# Patient Record
Sex: Female | Born: 1975 | Race: Black or African American | Hispanic: No | Marital: Single | Smoking: Current every day smoker
Health system: Southern US, Community
[De-identification: ages and names within clinical notes are randomized; demographics above are authoritative.]

## PROBLEM LIST (undated history)

## (undated) ENCOUNTER — Inpatient Hospital Stay (HOSPITAL_COMMUNITY): Payer: Self-pay

## (undated) DIAGNOSIS — S86921A Laceration of unspecified muscle(s) and tendon(s) at lower leg level, right leg, initial encounter: Secondary | ICD-10-CM

## (undated) DIAGNOSIS — D219 Benign neoplasm of connective and other soft tissue, unspecified: Secondary | ICD-10-CM

## (undated) DIAGNOSIS — I82409 Acute embolism and thrombosis of unspecified deep veins of unspecified lower extremity: Secondary | ICD-10-CM

## (undated) DIAGNOSIS — D649 Anemia, unspecified: Secondary | ICD-10-CM

## (undated) DIAGNOSIS — E669 Obesity, unspecified: Secondary | ICD-10-CM

## (undated) DIAGNOSIS — S81011A Laceration without foreign body, right knee, initial encounter: Secondary | ICD-10-CM

## (undated) DIAGNOSIS — K449 Diaphragmatic hernia without obstruction or gangrene: Secondary | ICD-10-CM

## (undated) DIAGNOSIS — S83281A Other tear of lateral meniscus, current injury, right knee, initial encounter: Secondary | ICD-10-CM

## (undated) HISTORY — DX: Anemia, unspecified: D64.9

## (undated) HISTORY — PX: HIATAL HERNIA REPAIR: SHX195

## (undated) HISTORY — PX: DILATION AND CURETTAGE OF UTERUS: SHX78

## (undated) HISTORY — DX: Obesity, unspecified: E66.9

## (undated) HISTORY — PX: HERNIA REPAIR: SHX51

## (undated) HISTORY — DX: Acute embolism and thrombosis of unspecified deep veins of unspecified lower extremity: I82.409

## (undated) HISTORY — PX: NO PAST SURGERIES: SHX2092

---

## 1998-04-21 ENCOUNTER — Encounter: Admission: RE | Admit: 1998-04-21 | Discharge: 1998-04-21 | Payer: Self-pay | Admitting: Family Medicine

## 1998-04-22 ENCOUNTER — Encounter: Admission: RE | Admit: 1998-04-22 | Discharge: 1998-04-22 | Payer: Self-pay | Admitting: Family Medicine

## 1998-04-24 ENCOUNTER — Encounter: Admission: RE | Admit: 1998-04-24 | Discharge: 1998-04-24 | Payer: Self-pay | Admitting: Sports Medicine

## 1998-05-13 ENCOUNTER — Encounter: Admission: RE | Admit: 1998-05-13 | Discharge: 1998-05-13 | Payer: Self-pay | Admitting: Family Medicine

## 1998-05-28 ENCOUNTER — Encounter: Admission: RE | Admit: 1998-05-28 | Discharge: 1998-05-28 | Payer: Self-pay | Admitting: Family Medicine

## 1998-06-04 ENCOUNTER — Encounter: Admission: RE | Admit: 1998-06-04 | Discharge: 1998-06-04 | Payer: Self-pay | Admitting: Family Medicine

## 1998-06-11 ENCOUNTER — Encounter: Admission: RE | Admit: 1998-06-11 | Discharge: 1998-06-11 | Payer: Self-pay | Admitting: Sports Medicine

## 1998-07-21 ENCOUNTER — Encounter: Admission: RE | Admit: 1998-07-21 | Discharge: 1998-07-21 | Payer: Self-pay | Admitting: Sports Medicine

## 1998-09-04 ENCOUNTER — Encounter: Admission: RE | Admit: 1998-09-04 | Discharge: 1998-09-04 | Payer: Self-pay | Admitting: Family Medicine

## 1998-10-23 ENCOUNTER — Encounter: Admission: RE | Admit: 1998-10-23 | Discharge: 1998-10-23 | Payer: Self-pay | Admitting: Family Medicine

## 1998-10-30 ENCOUNTER — Encounter: Admission: RE | Admit: 1998-10-30 | Discharge: 1998-10-30 | Payer: Self-pay | Admitting: Family Medicine

## 1999-03-04 ENCOUNTER — Encounter: Admission: RE | Admit: 1999-03-04 | Discharge: 1999-03-04 | Payer: Self-pay | Admitting: Family Medicine

## 1999-06-09 ENCOUNTER — Encounter: Admission: RE | Admit: 1999-06-09 | Discharge: 1999-06-09 | Payer: Self-pay | Admitting: Family Medicine

## 1999-08-11 ENCOUNTER — Encounter: Admission: RE | Admit: 1999-08-11 | Discharge: 1999-08-11 | Payer: Self-pay | Admitting: Family Medicine

## 1999-08-31 ENCOUNTER — Encounter: Admission: RE | Admit: 1999-08-31 | Discharge: 1999-08-31 | Payer: Self-pay | Admitting: Family Medicine

## 2000-12-01 ENCOUNTER — Encounter: Admission: RE | Admit: 2000-12-01 | Discharge: 2000-12-01 | Payer: Self-pay | Admitting: Family Medicine

## 2000-12-01 ENCOUNTER — Other Ambulatory Visit: Admission: RE | Admit: 2000-12-01 | Discharge: 2000-12-01 | Payer: Self-pay | Admitting: Obstetrics & Gynecology

## 2001-03-04 ENCOUNTER — Emergency Department (HOSPITAL_COMMUNITY): Admission: EM | Admit: 2001-03-04 | Discharge: 2001-03-04 | Payer: Self-pay | Admitting: Emergency Medicine

## 2001-03-14 ENCOUNTER — Encounter: Admission: RE | Admit: 2001-03-14 | Discharge: 2001-03-14 | Payer: Self-pay | Admitting: Family Medicine

## 2001-04-11 ENCOUNTER — Encounter: Admission: RE | Admit: 2001-04-11 | Discharge: 2001-04-11 | Payer: Self-pay | Admitting: Family Medicine

## 2001-04-25 ENCOUNTER — Ambulatory Visit (HOSPITAL_COMMUNITY): Admission: RE | Admit: 2001-04-25 | Discharge: 2001-04-25 | Payer: Self-pay

## 2001-07-15 ENCOUNTER — Inpatient Hospital Stay (HOSPITAL_COMMUNITY): Admission: AD | Admit: 2001-07-15 | Discharge: 2001-07-17 | Payer: Self-pay | Admitting: Obstetrics & Gynecology

## 2001-10-12 ENCOUNTER — Encounter: Admission: RE | Admit: 2001-10-12 | Discharge: 2001-10-12 | Payer: Self-pay | Admitting: Family Medicine

## 2001-12-26 DIAGNOSIS — I82409 Acute embolism and thrombosis of unspecified deep veins of unspecified lower extremity: Secondary | ICD-10-CM

## 2001-12-26 HISTORY — DX: Acute embolism and thrombosis of unspecified deep veins of unspecified lower extremity: I82.409

## 2002-04-14 ENCOUNTER — Encounter: Payer: Self-pay | Admitting: Internal Medicine

## 2002-04-14 ENCOUNTER — Inpatient Hospital Stay (HOSPITAL_COMMUNITY): Admission: EM | Admit: 2002-04-14 | Discharge: 2002-04-26 | Payer: Self-pay | Admitting: Emergency Medicine

## 2002-04-15 ENCOUNTER — Encounter: Payer: Self-pay | Admitting: Internal Medicine

## 2002-04-21 ENCOUNTER — Encounter: Payer: Self-pay | Admitting: Internal Medicine

## 2002-04-22 ENCOUNTER — Encounter: Payer: Self-pay | Admitting: Internal Medicine

## 2002-06-13 ENCOUNTER — Encounter: Admission: RE | Admit: 2002-06-13 | Discharge: 2002-06-13 | Payer: Self-pay | Admitting: Family Medicine

## 2002-06-13 ENCOUNTER — Ambulatory Visit (HOSPITAL_COMMUNITY): Admission: RE | Admit: 2002-06-13 | Discharge: 2002-06-13 | Payer: Self-pay | Admitting: *Deleted

## 2003-05-27 ENCOUNTER — Encounter (INDEPENDENT_AMBULATORY_CARE_PROVIDER_SITE_OTHER): Payer: Self-pay | Admitting: *Deleted

## 2003-05-27 LAB — CONVERTED CEMR LAB

## 2003-06-09 ENCOUNTER — Encounter: Admission: RE | Admit: 2003-06-09 | Discharge: 2003-06-09 | Payer: Self-pay | Admitting: Family Medicine

## 2003-06-09 ENCOUNTER — Other Ambulatory Visit: Admission: RE | Admit: 2003-06-09 | Discharge: 2003-06-09 | Payer: Self-pay | Admitting: Family Medicine

## 2004-03-03 ENCOUNTER — Encounter: Admission: RE | Admit: 2004-03-03 | Discharge: 2004-03-03 | Payer: Self-pay | Admitting: Family Medicine

## 2005-08-25 ENCOUNTER — Ambulatory Visit: Payer: Self-pay | Admitting: Family Medicine

## 2006-05-18 ENCOUNTER — Emergency Department (HOSPITAL_COMMUNITY): Admission: EM | Admit: 2006-05-18 | Discharge: 2006-05-18 | Payer: Self-pay | Admitting: Emergency Medicine

## 2006-08-22 ENCOUNTER — Inpatient Hospital Stay (HOSPITAL_COMMUNITY): Admission: AD | Admit: 2006-08-22 | Discharge: 2006-08-23 | Payer: Self-pay | Admitting: Obstetrics and Gynecology

## 2006-08-24 ENCOUNTER — Inpatient Hospital Stay (HOSPITAL_COMMUNITY): Admission: RE | Admit: 2006-08-24 | Discharge: 2006-08-24 | Payer: Self-pay | Admitting: Obstetrics and Gynecology

## 2007-02-22 DIAGNOSIS — I80299 Phlebitis and thrombophlebitis of other deep vessels of unspecified lower extremity: Secondary | ICD-10-CM

## 2007-02-22 DIAGNOSIS — Z720 Tobacco use: Secondary | ICD-10-CM | POA: Insufficient documentation

## 2007-02-22 DIAGNOSIS — D509 Iron deficiency anemia, unspecified: Secondary | ICD-10-CM

## 2007-02-22 DIAGNOSIS — E669 Obesity, unspecified: Secondary | ICD-10-CM

## 2007-02-22 HISTORY — DX: Phlebitis and thrombophlebitis of other deep vessels of unspecified lower extremity: I80.299

## 2007-02-23 ENCOUNTER — Encounter (INDEPENDENT_AMBULATORY_CARE_PROVIDER_SITE_OTHER): Payer: Self-pay | Admitting: *Deleted

## 2007-10-13 ENCOUNTER — Emergency Department (HOSPITAL_COMMUNITY): Admission: EM | Admit: 2007-10-13 | Discharge: 2007-10-13 | Payer: Self-pay | Admitting: Emergency Medicine

## 2007-10-15 ENCOUNTER — Inpatient Hospital Stay (HOSPITAL_COMMUNITY): Admission: AD | Admit: 2007-10-15 | Discharge: 2007-10-15 | Payer: Self-pay | Admitting: Family Medicine

## 2007-11-01 ENCOUNTER — Encounter (INDEPENDENT_AMBULATORY_CARE_PROVIDER_SITE_OTHER): Payer: Self-pay | Admitting: *Deleted

## 2007-11-06 ENCOUNTER — Telehealth: Payer: Self-pay | Admitting: *Deleted

## 2007-11-06 ENCOUNTER — Inpatient Hospital Stay (HOSPITAL_COMMUNITY): Admission: AD | Admit: 2007-11-06 | Discharge: 2007-11-06 | Payer: Self-pay | Admitting: Obstetrics & Gynecology

## 2007-12-21 ENCOUNTER — Telehealth (INDEPENDENT_AMBULATORY_CARE_PROVIDER_SITE_OTHER): Payer: Self-pay | Admitting: Family Medicine

## 2007-12-24 ENCOUNTER — Ambulatory Visit: Payer: Self-pay | Admitting: Sports Medicine

## 2007-12-24 ENCOUNTER — Encounter: Payer: Self-pay | Admitting: Family Medicine

## 2007-12-24 LAB — CONVERTED CEMR LAB
Antibody Screen: NEGATIVE
Basophils Relative: 0 % (ref 0–1)
Eosinophils Absolute: 0.1 10*3/uL (ref 0.0–0.7)
Eosinophils Relative: 1 % (ref 0–5)
Hemoglobin: 9.7 g/dL — ABNORMAL LOW (ref 12.0–15.0)
Lymphs Abs: 1.8 10*3/uL (ref 0.7–4.0)
MCHC: 31.2 g/dL (ref 30.0–36.0)
Monocytes Relative: 8 % (ref 3–12)
Neutrophils Relative %: 67 % (ref 43–77)
Platelets: 292 10*3/uL (ref 150–400)
Rh Type: POSITIVE

## 2008-01-01 ENCOUNTER — Other Ambulatory Visit: Admission: RE | Admit: 2008-01-01 | Discharge: 2008-01-01 | Payer: Self-pay | Admitting: Family Medicine

## 2008-01-01 ENCOUNTER — Ambulatory Visit: Payer: Self-pay | Admitting: Family Medicine

## 2008-01-01 DIAGNOSIS — R12 Heartburn: Secondary | ICD-10-CM

## 2008-01-01 DIAGNOSIS — F411 Generalized anxiety disorder: Secondary | ICD-10-CM

## 2008-01-01 DIAGNOSIS — D649 Anemia, unspecified: Secondary | ICD-10-CM

## 2008-01-01 HISTORY — DX: Generalized anxiety disorder: F41.1

## 2008-01-01 LAB — CONVERTED CEMR LAB: Glucose, Urine, Semiquant: NEGATIVE

## 2008-01-02 ENCOUNTER — Encounter: Payer: Self-pay | Admitting: Family Medicine

## 2008-01-02 LAB — CONVERTED CEMR LAB: GC Probe Amp, Genital: NEGATIVE

## 2008-01-03 LAB — CONVERTED CEMR LAB: GC Culture Only: NEGATIVE

## 2008-01-07 ENCOUNTER — Encounter: Payer: Self-pay | Admitting: Family Medicine

## 2008-01-09 ENCOUNTER — Encounter: Payer: Self-pay | Admitting: Family Medicine

## 2008-01-09 ENCOUNTER — Telehealth: Payer: Self-pay | Admitting: *Deleted

## 2008-01-10 ENCOUNTER — Encounter: Payer: Self-pay | Admitting: Family Medicine

## 2008-01-10 ENCOUNTER — Ambulatory Visit: Payer: Self-pay | Admitting: Family Medicine

## 2008-01-11 DIAGNOSIS — T7491XA Unspecified adult maltreatment, confirmed, initial encounter: Secondary | ICD-10-CM | POA: Insufficient documentation

## 2008-01-11 HISTORY — DX: Unspecified adult maltreatment, confirmed, initial encounter: T74.91XA

## 2008-01-14 ENCOUNTER — Ambulatory Visit (HOSPITAL_COMMUNITY): Admission: RE | Admit: 2008-01-14 | Discharge: 2008-01-14 | Payer: Self-pay | Admitting: Family Medicine

## 2008-01-14 ENCOUNTER — Encounter: Payer: Self-pay | Admitting: Family Medicine

## 2008-01-16 ENCOUNTER — Telehealth: Payer: Self-pay | Admitting: *Deleted

## 2008-01-23 ENCOUNTER — Encounter: Payer: Self-pay | Admitting: Family Medicine

## 2008-01-24 ENCOUNTER — Telehealth: Payer: Self-pay | Admitting: Family Medicine

## 2008-01-29 ENCOUNTER — Telehealth: Payer: Self-pay | Admitting: Family Medicine

## 2008-02-06 ENCOUNTER — Encounter: Payer: Self-pay | Admitting: Family Medicine

## 2008-02-06 ENCOUNTER — Ambulatory Visit: Payer: Self-pay | Admitting: Family Medicine

## 2008-02-07 LAB — CONVERTED CEMR LAB: Glucose, 2 hour: 99 mg/dL (ref 70–139)

## 2008-03-03 ENCOUNTER — Telehealth: Payer: Self-pay | Admitting: *Deleted

## 2008-03-05 ENCOUNTER — Ambulatory Visit: Payer: Self-pay | Admitting: Family Medicine

## 2008-03-05 ENCOUNTER — Encounter: Payer: Self-pay | Admitting: Family Medicine

## 2008-03-06 ENCOUNTER — Encounter: Payer: Self-pay | Admitting: Family Medicine

## 2008-03-20 ENCOUNTER — Encounter: Payer: Self-pay | Admitting: Family Medicine

## 2008-03-20 ENCOUNTER — Ambulatory Visit: Payer: Self-pay | Admitting: Family Medicine

## 2008-03-20 LAB — CONVERTED CEMR LAB

## 2008-03-25 LAB — CONVERTED CEMR LAB
Hemoglobin: 7.9 g/dL — CL (ref 12.0–15.0)
MCHC: 29 g/dL — ABNORMAL LOW (ref 30.0–36.0)
Platelets: 232 10*3/uL (ref 150–400)
RBC: 3.61 M/uL — ABNORMAL LOW (ref 3.87–5.11)
RDW: 17.6 % — ABNORMAL HIGH (ref 11.5–15.5)

## 2008-03-27 ENCOUNTER — Inpatient Hospital Stay (HOSPITAL_COMMUNITY): Admission: AD | Admit: 2008-03-27 | Discharge: 2008-03-27 | Payer: Self-pay | Admitting: Obstetrics & Gynecology

## 2008-04-07 ENCOUNTER — Encounter: Payer: Self-pay | Admitting: Family Medicine

## 2008-04-07 ENCOUNTER — Ambulatory Visit: Payer: Self-pay | Admitting: Sports Medicine

## 2008-04-07 LAB — CONVERTED CEMR LAB: Glucose, Urine, Semiquant: NEGATIVE

## 2008-04-24 ENCOUNTER — Telehealth: Payer: Self-pay | Admitting: Family Medicine

## 2008-04-30 ENCOUNTER — Ambulatory Visit: Payer: Self-pay | Admitting: Family Medicine

## 2008-04-30 LAB — CONVERTED CEMR LAB

## 2008-05-01 ENCOUNTER — Ambulatory Visit (HOSPITAL_COMMUNITY): Admission: RE | Admit: 2008-05-01 | Discharge: 2008-05-01 | Payer: Self-pay | Admitting: Family Medicine

## 2008-05-01 ENCOUNTER — Encounter: Payer: Self-pay | Admitting: Family Medicine

## 2008-05-15 ENCOUNTER — Encounter: Payer: Self-pay | Admitting: Family Medicine

## 2008-05-15 ENCOUNTER — Ambulatory Visit: Payer: Self-pay | Admitting: Family Medicine

## 2008-05-15 LAB — CONVERTED CEMR LAB
GC Probe Amp, Genital: NEGATIVE
Protein, U semiquant: NEGATIVE
Whiff Test: NEGATIVE

## 2008-05-20 ENCOUNTER — Inpatient Hospital Stay (HOSPITAL_COMMUNITY): Admission: AD | Admit: 2008-05-20 | Discharge: 2008-05-20 | Payer: Self-pay | Admitting: Obstetrics & Gynecology

## 2008-05-21 ENCOUNTER — Ambulatory Visit: Payer: Self-pay | Admitting: Family Medicine

## 2008-05-21 LAB — CONVERTED CEMR LAB

## 2008-05-27 ENCOUNTER — Inpatient Hospital Stay (HOSPITAL_COMMUNITY): Admission: AD | Admit: 2008-05-27 | Discharge: 2008-05-29 | Payer: Self-pay | Admitting: Gynecology

## 2008-05-27 ENCOUNTER — Ambulatory Visit: Payer: Self-pay | Admitting: *Deleted

## 2008-06-02 ENCOUNTER — Telehealth: Payer: Self-pay | Admitting: *Deleted

## 2009-06-19 ENCOUNTER — Ambulatory Visit: Payer: Self-pay | Admitting: Family Medicine

## 2009-06-19 ENCOUNTER — Encounter: Payer: Self-pay | Admitting: Family Medicine

## 2009-06-22 ENCOUNTER — Encounter: Payer: Self-pay | Admitting: Family Medicine

## 2009-06-22 LAB — CONVERTED CEMR LAB
ALT: <8 units/L (ref 0–35)
Albumin: 3.9 g/dL (ref 3.5–5.2)
BUN: 8 mg/dL (ref 6–23)
Calcium: 8.5 mg/dL (ref 8.4–10.5)
Chloride: 111 meq/L (ref 96–112)
Cholesterol: 214 mg/dL — ABNORMAL HIGH (ref 0–200)
Creatinine, Ser: 0.88 mg/dL (ref 0.40–1.20)
HCT: 32.5 % — ABNORMAL LOW (ref 36.0–46.0)
HDL: 39 mg/dL — ABNORMAL LOW (ref >39)
Hemoglobin: 9.5 g/dL — ABNORMAL LOW (ref 12.0–15.0)
LDL Cholesterol: 154 mg/dL — ABNORMAL HIGH (ref 0–99)
MCV: 76.5 fL — ABNORMAL LOW (ref 78.0–100.0)
Potassium: 4.1 meq/L (ref 3.5–5.3)
RBC: 4.25 M/uL (ref 3.87–5.11)
Sodium: 141 meq/L (ref 135–145)
Total Bilirubin: 0.3 mg/dL (ref 0.3–1.2)
Total Protein: 6.6 g/dL (ref 6.0–8.3)
VLDL: 21 mg/dL (ref 0–40)

## 2009-06-23 ENCOUNTER — Encounter: Payer: Self-pay | Admitting: *Deleted

## 2009-09-08 ENCOUNTER — Inpatient Hospital Stay (HOSPITAL_COMMUNITY): Admission: AD | Admit: 2009-09-08 | Discharge: 2009-09-08 | Payer: Self-pay | Admitting: Family Medicine

## 2009-09-10 ENCOUNTER — Emergency Department (HOSPITAL_COMMUNITY): Admission: EM | Admit: 2009-09-10 | Discharge: 2009-09-10 | Payer: Self-pay | Admitting: Family Medicine

## 2010-07-12 ENCOUNTER — Encounter: Payer: Self-pay | Admitting: Family Medicine

## 2010-12-16 ENCOUNTER — Encounter: Payer: Self-pay | Admitting: Family Medicine

## 2011-01-16 ENCOUNTER — Encounter: Payer: Self-pay | Admitting: Obstetrics and Gynecology

## 2011-01-25 NOTE — Consult Note (Signed)
Summary: Francene Boyers, O.D., P.A.  Francene Boyers, O.D., P.A.   Imported By: Knox Royalty 07/17/2010 12:43:35  _____________________________________________________________________  External Attachment:    Type:   Image     Comment:   External Document

## 2011-01-27 NOTE — Miscellaneous (Signed)
  Clinical Lists Changes  Problems: Removed problem of CONTRACEPTIVE MANAGEMENT (ICD-V25.09) Removed problem of SCREENING FOR ISCHEMIC HEART DISEASE (ICD-V81.0) Removed problem of GROUP B STREPTOCOCCUS CARRIER (ICD-V02.51)

## 2011-02-09 ENCOUNTER — Ambulatory Visit (HOSPITAL_COMMUNITY): Payer: Self-pay

## 2011-02-16 ENCOUNTER — Encounter: Payer: Self-pay | Admitting: *Deleted

## 2011-03-07 ENCOUNTER — Encounter: Payer: Self-pay | Admitting: *Deleted

## 2011-04-01 LAB — GC/CHLAMYDIA PROBE AMP, GENITAL
Chlamydia, DNA Probe: NEGATIVE
GC Probe Amp, Genital: NEGATIVE

## 2011-04-01 LAB — POCT I-STAT, CHEM 8: HCT: 40 % (ref 36.0–46.0)

## 2011-04-01 LAB — WET PREP, GENITAL: Yeast Wet Prep HPF POC: NONE SEEN

## 2011-05-13 NOTE — Discharge Summary (Signed)
[facility]  Patient:    Candace Ramirez, Candace Ramirez Visit Number: [id number] MRN: [id number]          Service Type: MED Location: [phone] Attending Physician:  Miguel Aschoff Dictated by:   [name], M.D. Admit Date:  04/14/2002 Discharge Date: 04/26/2002   CC:         Dr. Allyson Sabal, Emmaline Kluver  Adolph Pollack, M.D.   Discharge Summary  DISCHARGE DIAGNOSES: 1. Left lower extremity deep venous thrombosis. 2. Bullous cellulitis. 3. Cigarette smoking. 4. Obesity. 5. Microcytic anemia consistent with iron deficiency.    a. Microcytic anemia, discharge hemoglobin 8.5, hematocrit 26.9, MCV 73.5.    b. Total iron 46, total iron binding capacity 274, percent saturation 17,       ferritin 18.  DISCHARGE MEDICATIONS: 1. Coumadin 5 mg p.o. q.d. with 7-day prescription.  (The patient will be    seen by Dr. Allyson Sabal on Monday, Apr 29, 2002, whereupon the Coumadin dose    will be adjusted based on INR.) 2. Ferrous sulfate 325 mg p.o. t.i.d. 3. Keflex 500 mg p.o. q.i.d. 4. Motrin 800 mg p.o. t.i.d. p.r.n. 5. Nicotine patch 40 mg, 24-hour patch, 1 patch to apply q.d. (Remove old    patch before applying new patch.)  DIAGNOSTIC DATA:  WBC 4.6, hemoglobin 8.5, hematocrit 36.8, MCV 73.5, platelet count 424.  Pro time 22.7, INR 2.3.  Sodium 140, potassium 3.7, chloride 108, CO2 27, glucose 98, BUN 2, creatinine 0.8, calcium 8.8.  HIV studies nonreactive.  CONSULTATIONS:  Dr. Orvan Falconer of infectious disease and Adolph Pollack, M.D. of general surgery.  PROCEDURES:  Incision and drainage of abscess of the left lower extremity by Dr. Derryl Harbor.  CONDITION UPON DISCHARGE:  Improved and stable.  ACTIVITY:  As tolerated.  DIET:  Regular.  WOUND CARE:  The patient was asked to do soaking of her affected foot daily as she has been doing in house.  The patient will be followed by [facility] to provide assistance with dressing changes at  home.  SPECIAL INSTRUCTIONS:  The patient has been instructed to stop smoking, keep left foot elevated when she is at rest, and not walk.  She will read book on Warfarin.  She has been asked to check her stools and tell MD if there is any black color of her stools.  She is to avoid activities which may cause traumatic injury.  She was to go to MD if she experiences any dizziness, fever, chills, or worsening swelling or redness of left lower extremity.  FOLLOWUP:  Appointment will be with Dr. Allyson Sabal of [facility], [phone], at 11 a.m. on Monday, Apr 29, 2002.  She is also to follow up with Dr. Abbey Chatters in two weeks.  She is to call for appointment. Dr. Arne Cleveland telephone number has been given to the patient and is [phone].  HISTORY OF PRESENT ILLNESS:  [name] was admitted on April 14, 2002, by Dr. Nehemiah Settle because of leg pain and swelling.  There was no history of chest pain, shortness of breath, PND, dyspnea on exertion, or palpitations.  There was no history of any leg trauma.  She had not had any long trips and has not been on any long plane rides.  She has not been on any birth control pills for more than three years.  She does not have any history of DVT or PE in her family.  Denies family history of miscarriages.  The patient was admitted  to the medical service on account of sonography which revealed DVT in her left calf.  For complete admitting information, please see H&P dictated by Dr. Nehemiah Settle on April 14, 2002.  HOSPITAL COURSE:  #1 - LEFT CALF DEEP VENOUS THROMBOSIS:  The patient was admitted to the medical service.  She was placed on anticoagulation treatment.  Workup of protein, prothrombin gene mutation, factor V Leiden, and ANA and anticardiolipin as well as total protein C and protein S within normal limits. Antithrombin III was also normal.  She had positive glucose anticoagulant of uncertain significance.  Her INR for the last three days have been  therapeutic (within 2.0 to 3.0).  She will need about six months of anticoagulation with Coumadin.  The patient has been started on the use of Coumadin and the need to avoid any traumatic activities as indicated above.  I discussed the side effects of Coumadin with respect to thinning of blood.  She will be checking her teeth for any evidence of bleed or any evidence of blood per teeth.  She has been instructed that she should report any dizziness.  In our opinion, believe cigarette smoking may be etiologic in this patient.  She is instructed to stop smoking and lose weight.  Since in this patient has been recurrent, believe that she may need lifelong Coumadin therapy.  #2 - SEVERE CELLULITIS IN LEFT LOWER EXTREMITY:  The patient developed redness and hematoma of left lower extremity, and she was initiated on IV antibiotics and vancomycin and gentamicin initially on account of cellulitis.  She has some bullous lesions around her feet as well.  We have consulted Dr. Orvan Falconer of infectious disease who ______.  The patient remained afebrile, and her leukocyte counts were within normal limits.  She had some improvement in erythema and swelling, and she was subsequently switched to p.o. Keflex on the day before discharge.  She remained afebrile throughout the last 24 hours of her hospital stay.  The bullous lesions were initially seen on MRI which indicated edema with some abscess.  The abscess were drained by Dr. Abbey Chatters of general surgery with initiation of daily dressing changes with soaking of left lower extremity.  Dr. Abbey Chatters agrees with our discharge plan, and the patient will be following up with him as indicated above.  We also initiated followup care by [facility] at home.  The patient will continue antibiotics Keflex for a total of 7 more days.  HIV testing was advised by infectious disease on account of spontaneous DVT in  otherwise healthy lady, and the testing was  negative.  #3 - MICROCYTIC ANEMIA SECONDARY TO IRON DEFICIENCY:  The patient has a history of menorrhagia, and this is most likely etiologic cause of anemia. She is currently asymptomatic.  She is on iron tablets, and she will need outpatient follow up of hemoglobin as well as workup of menorrhagia by a gynecologist.  #4 - CIGARETTE SMOKING:  The patient has been counselled with respect the affect of smoking on her general health as well as DVT as indicated above. She has agreed to smoking cessation program which was initiated in house with nicotine patch as indicated above, and she will need outpatient reinforcement of cigarette smoking cessation.  I spent more than 45 minutes a day in coordinating discharge planning and discharge followup of this patient. Dictated by:   [name], M.D. Attending Physician:  Miguel Aschoff DD:  04/26/02 TD:  04/29/02 Job: [id number]

## 2011-05-13 NOTE — Consult Note (Signed)
[facility]  Patient:    Candace Ramirez, Candace Ramirez Visit Number: [id number] MRN: [id number]          Service Type: MED Location: [phone] Attending Physician:  Gracelyn Nurse Dictated by:   Adolph Pollack, M.D. Admit Date:  04/14/2002   CC:         [name], M.D.   Consultation Report  REFERRING PHYSICIAN:  [name], M.D.  REASON FOR CONSULTATION:  "Bullous left lower extremity cellulitis."  HISTORY OF PRESENT ILLNESS:  The patient is a 35 year old female who awoke with left leg pain that continued through the day and was associated with swelling.  She presented and was evaluated and was noted to have a deep vein thrombosis.  She subsequently was started on anticoagulation with heparin and Coumadin.  She was febrile at the time, with a temperature up to 103.1 degrees.  It was noted that she was having some left lower extremity cellulitis and had been started on Ancef.  Bullous lesions were then noted to be forming.  An infectious disease consult was obtained.  There was some question of Warfarin-type necrosis.  This was felt to be just a severe cellulitis with staph or strep compounded by the fact that she had a deep venous thrombosis.  Despite antibiotics, she continued to have no significant improvement, and I was asked to see her for that reason.  PAST MEDICAL HISTORY: 1. Anemia. 2. Morbid obesity.  PAST SURGICAL HISTORY:  None.  ALLERGIES:  None reported.  SOCIAL HISTORY:  She smokes 1/2 pack of cigarettes per day.  She denies alcohol use.  MEDICATIONS:  Remarkable for the fact that she is on Unasyn and vancomycin by way of a PICC line.  This was discontinued today and she started back on Ancef 2 g IV q.8h.  PHYSICAL EXAMINATION:  GENERAL:  Obese female in no acute distress.  Very pleasant and cooperative. She is afebrile.  EXTREMITIES:  The right lower extremity is soft.  Multiple healed ulcerated-type wounds.   The left lower extremity has swelling below the knee. Erythema from the distal third of the lower extremity down through the foot. There is a fluctuant lesion on the lateral aspect of the foot that is tender to palpation.  PROCEDURE:  After sterilely preparing this area, I used a 21-gauge needle and aspirated purulent fluid.  I then made a cruciate incision in the area sharply and drained pus.  Some of this was sent for culture.  IMPRESSION: 1. Left lower extremity deep venous thrombosis - etiology unknown. 2. Left lower extremity cellulitis and abscess status post simple    incision and drainage.  PLAN:  Warm water soaks and packing of open wound twice a day.  Would continue IV antibiotics.  Careful wound observation. Dictated by:   Adolph Pollack, M.D. Attending Physician:  Marcelino Duster D DD:  04/23/02 TD:  04/23/02 Job: [id number]

## 2011-05-13 NOTE — H&P (Signed)
[facility]  Patient:    Candace Ramirez, Candace Ramirez Visit Number: [id number] MRN: [id number]          Service Type: MED Location: [phone] Attending Physician:  Gracelyn Nurse Dictated by:   Renford Dills, M.D. Admit Date:  04/14/2002                           History and Physical  DATE OF BIRTH:  [birth year].  PROBLEM LIST: 1. Left lower extremity deep vein thrombosis in the calf veins. 2. Obesity. 3. Tobacco abuse.  CHIEF COMPLAINT:  Left leg pain.  HISTORY OF PRESENT ILLNESS:  A 35 year old black female with above medical problems, who presented to the ED complaining of left leg pain, swelling, and redness.  The patient stated that she just was in her usual state of health and just awoke with leg pain and swelling, which was aggravated by walking and moving around and stated that the symptoms progressed; therefore, she presented to the ED.  She denied any chest pain, shortness of breath, PND, no dyspnea on exertion, no palpitations.  Denies any leg trauma.  No long trips, no long plane rides, no birth control pills x3 years.  No history of DVT or PE in herself or her family.  Denies any miscarriages as well.  Ultrasound in the ED revealed clot in the left calf veins.  Admission was deemed necessary for further evaluation and treatment.  PAST MEDICAL HISTORY:  See problem list.  MEDICATIONS:  None.  The patient denies birth control pills, no aspirin, no multivitamins, no herbal medicines.  SOCIAL HISTORY:  Positive for tobacco, half pack a day x5 years.  No alcohol and no drugs.  PAST SURGICAL HISTORY:  Negative.  ALLERGIES:  No known drug allergies.  REVIEW OF SYSTEMS:  Per HPI.  FAMILY HISTORY:  Mother healthy, no problems.  Father deceased.  Patient does not know why.  Patient does state that her paternal and maternal grandmothers both had cancer; however, she is unaware of the type.  PHYSICAL EXAMINATION:  VITAL SIGNS:  BP  119/53, pulse 104, respiratory rate of 18, saturating 95% on room air.  Temperature 101.9, repeat 97.9.  GENERAL:  The patient is in moderate distress secondary to leg pain.  HEENT:  Pupils are equal, round and reactive to light.  NECK:  Supple.  No adenopathy appreciated.  CHEST:  Clear to auscultation.  CARDIAC:  Regular, no S3.  ABDOMEN:  Morbid obesity.  EXTREMITIES:  2+ pulse to the left lower extremity.  Significant for erythema over the medial malleolus.  There is positive calf pain and a positive Homans sign.  NEUROLOGIC:  Otherwise nonfocal.  RECTAL:  No mass, heme-negative.  SKIN:  Multiple macular scars on the legs and face from repeated excoriation.  LABORATORY DATA:  BMET essentially normal.  CBC:  Hemoglobin was 10.2.  PT 15.3, INR 1.3, PTT of 44.  ASSESSMENT AND PLAN: 1. Deep vein thrombosis in the calf vein in an obese smoker.  Patient with an    extremely symptomatic leg.  Will treat with Lovenox with conversion to    Coumadin; however, an option may be to do serial ultrasounds and to treat    only if propagation of clot.  Will defer this treatment if deemed    appropriate by the following of _____ and probably will recommend a    hematology consult if patient is to be followed up with  serial ultrasounds.    However, in light of the patients severe symptoms from her deep vein    thrombosis, recommend treatment at this time.  Patients risk factors for    clotting include smoking and obesity.  At this time because of the mildly    abnormal coagulation studies, will also obtain a hypocoagulable evaluation.    It is unknown if the above coagulation studies represented error or not. 2. Anemia with low MCV.  The patient is fairly young with a history of    menorrhagia and noncompliance with iron replacement.  Will continue iron    and follow up at the appropriate treatment. 3. Obesity.  Needs lifestyle modification.  Probably should check lipids and    fasting  CBG. 4. Elevated temperature, probably secondary to deep vein thrombosis.  The    patients left lower extremity has warmth and erythema.  Recommend    follow-up for signs of cellulitis/phlebitis.  Will make further recommendations after review of the above studies. Dictated by:   Renford Dills, M.D. Attending Physician:  Marcelino Duster D DD:  04/15/02 TD:  04/15/02 Job: [id number]

## 2011-07-21 ENCOUNTER — Encounter: Payer: Self-pay | Admitting: Family Medicine

## 2011-07-21 ENCOUNTER — Ambulatory Visit (INDEPENDENT_AMBULATORY_CARE_PROVIDER_SITE_OTHER): Payer: Self-pay | Admitting: Family Medicine

## 2011-07-21 ENCOUNTER — Other Ambulatory Visit (HOSPITAL_COMMUNITY)
Admission: RE | Admit: 2011-07-21 | Discharge: 2011-07-21 | Disposition: A | Discharge status: Home / Self Care | Payer: Self-pay | Source: Ambulatory Visit | Attending: Family Medicine | Admitting: Family Medicine

## 2011-07-21 DIAGNOSIS — Z7189 Other specified counseling: Secondary | ICD-10-CM

## 2011-07-21 DIAGNOSIS — Z309 Encounter for contraceptive management, unspecified: Secondary | ICD-10-CM

## 2011-07-21 DIAGNOSIS — Z01419 Encounter for gynecological examination (general) (routine) without abnormal findings: Secondary | ICD-10-CM | POA: Insufficient documentation

## 2011-07-21 DIAGNOSIS — M25472 Effusion, left ankle: Secondary | ICD-10-CM

## 2011-07-21 DIAGNOSIS — Z202 Contact with and (suspected) exposure to infections with a predominantly sexual mode of transmission: Secondary | ICD-10-CM

## 2011-07-21 DIAGNOSIS — F411 Generalized anxiety disorder: Secondary | ICD-10-CM

## 2011-07-21 DIAGNOSIS — R631 Polydipsia: Secondary | ICD-10-CM

## 2011-07-21 DIAGNOSIS — Z124 Encounter for screening for malignant neoplasm of cervix: Secondary | ICD-10-CM

## 2011-07-21 DIAGNOSIS — F17200 Nicotine dependence, unspecified, uncomplicated: Secondary | ICD-10-CM

## 2011-07-21 DIAGNOSIS — M25473 Effusion, unspecified ankle: Secondary | ICD-10-CM

## 2011-07-21 LAB — BASIC METABOLIC PANEL
BUN: 11 mg/dL (ref 6–23)
CO2: 23 mEq/L (ref 19–32)
Calcium: 9.1 mg/dL (ref 8.4–10.5)
Creat: 1.01 mg/dL (ref 0.50–1.10)
Glucose, Bld: 92 mg/dL (ref 70–99)

## 2011-07-21 MED ORDER — MEDROXYPROGESTERONE ACETATE 150 MG/ML IM SUSP
150.0000 mg | Freq: Once | INTRAMUSCULAR | Status: AC
  Administered 2011-07-21: 150 mg via INTRAMUSCULAR

## 2011-07-21 NOTE — Progress Notes (Signed)
  Subjective:    Patient ID: Candace Ramirez, female    DOB: 09/12/1976, 35 y.o.   MRN: [id number]  HPI Concern for Diabetes- No family history.  Concerned may have this b/c overweight.  Some increased thirst.  Increased hunger x 1 month.  No increased urinary frequency.  Concern for STD's- 1 partner, unsure if exposed,  Partner may have other partner.  No vaginal discharge.  Last menstrual period 07/06/11.  Not using condoms with partner  Birth control- Would like to be on birth control.  Would like depo injection.  Left ankle swelling- Had blood clot and abscess in left ankle in 2003. Since then has had swelling in left foot when up on feet for a long peroid of time.  No redness in joint.  No swelling in right ankle.    Health promotion- Last pap smear about 3 years ago.  Unsure if normal in past.  States she has never had a colposcopy. No nipple discharge.  No new lumps or bumps in breast.       Review of Systems     Objective:   Physical Exam  Constitutional: She is oriented to person, place, and time. She appears well-developed.       obese  HENT:  Head: Normocephalic and atraumatic.  Cardiovascular: Normal rate, regular rhythm and normal heart sounds.  Exam reveals no gallop and no friction rub.   No murmur heard. Pulmonary/Chest: Effort normal and breath sounds normal. No respiratory distress.  Abdominal: Soft. Bowel sounds are normal. She exhibits no distension. There is no tenderness.  Genitourinary: Vagina normal and uterus normal. No breast swelling or discharge. There is no rash or tenderness on the right labia. There is no rash or tenderness on the left labia. Cervix exhibits no motion tenderness and no friability. Right adnexum displays no mass and no tenderness. Left adnexum displays no mass and no tenderness.       Scant vaginal discharge  Musculoskeletal: Normal range of motion. She exhibits no edema.       Ankle exam: Full rom, no edema, no joint redness, no  pain with palpation bilateral.   Neurological: She is alert and oriented to person, place, and time.  Skin: No rash noted.  Psychiatric: She has a normal mood and affect. Her behavior is normal.          Assessment & Plan:

## 2011-07-21 NOTE — Patient Instructions (Signed)
Labs and pap smear: I will mail you the results or call if any medication needed. Return in 2-4 weeks for appointment to discuss your OCD and other symptoms you are having and possible treatment plans.

## 2011-07-22 ENCOUNTER — Encounter: Payer: Self-pay | Admitting: Family Medicine

## 2011-07-22 LAB — GC/CHLAMYDIA PROBE AMP, GENITAL: Chlamydia, DNA Probe: NEGATIVE

## 2011-08-08 ENCOUNTER — Encounter: Payer: Self-pay | Admitting: Family Medicine

## 2011-08-09 DIAGNOSIS — Z309 Encounter for contraceptive management, unspecified: Secondary | ICD-10-CM | POA: Insufficient documentation

## 2011-08-09 DIAGNOSIS — Z7189 Other specified counseling: Secondary | ICD-10-CM | POA: Insufficient documentation

## 2011-08-09 DIAGNOSIS — M25472 Effusion, left ankle: Secondary | ICD-10-CM | POA: Insufficient documentation

## 2011-08-09 DIAGNOSIS — Z202 Contact with and (suspected) exposure to infections with a predominantly sexual mode of transmission: Secondary | ICD-10-CM | POA: Insufficient documentation

## 2011-08-09 MED ORDER — MEDROXYPROGESTERONE ACETATE 150 MG/ML IM SUSP: 150.0000 mg | INTRAMUSCULAR | Status: DC

## 2011-08-09 NOTE — Assessment & Plan Note (Signed)
Pt given depo injection.  To return in 3 months.  Pt also considering placement of iud.

## 2011-08-09 NOTE — Assessment & Plan Note (Signed)
Off and on, exam wnl at office visit, most likely due to change in vasculature s/p surgery on this ankle, since this has been present since ankle sugery years ago.  Pt to keep elevated when at rest to decrease swelling.  Encouraged activity.  Pt to return if new or worsening of symptoms.

## 2011-08-09 NOTE — Assessment & Plan Note (Signed)
Encouraged smoking cessation 

## 2011-08-09 NOTE — Assessment & Plan Note (Signed)
GC/Chlam and wet prep performed.  Also HIV and RPR drawn.  Reviewed importance of safe sex.

## 2011-08-09 NOTE — Assessment & Plan Note (Signed)
Did not have time to discuss at today's appoint.  Pt to make an appointment in 2-4 weeks to discuss in more detail.

## 2011-08-09 NOTE — Assessment & Plan Note (Signed)
Pap smear completed 07/21/11- WNL Encouraged weight loss. CGB performed to screen for diabetes since at increased risk due to obesity and family history- was wnl.

## 2011-09-20 LAB — URINALYSIS, ROUTINE W REFLEX MICROSCOPIC
Glucose, UA: NEGATIVE
Hgb urine dipstick: NEGATIVE
Specific Gravity, Urine: 1.02
pH: 7

## 2011-09-20 LAB — WET PREP, GENITAL
Clue Cells Wet Prep HPF POC: NONE SEEN
Trich, Wet Prep: NONE SEEN
Yeast Wet Prep HPF POC: NONE SEEN

## 2011-09-20 LAB — GC/CHLAMYDIA PROBE AMP, GENITAL: GC Probe Amp, Genital: NEGATIVE

## 2011-09-22 LAB — CBC
HCT: 25.1 — ABNORMAL LOW
Hemoglobin: 8 — ABNORMAL LOW
MCHC: 31.9
MCV: 71.8 — ABNORMAL LOW
Platelets: 243
RDW: 21.1 — ABNORMAL HIGH
RDW: 21.2 — ABNORMAL HIGH
WBC: 9
WBC: 9.6

## 2011-09-22 LAB — RPR: RPR Ser Ql: NONREACTIVE

## 2011-10-05 LAB — URINALYSIS, ROUTINE W REFLEX MICROSCOPIC
Glucose, UA: NEGATIVE
Hgb urine dipstick: NEGATIVE
Ketones, ur: NEGATIVE
Protein, ur: NEGATIVE

## 2011-10-05 LAB — CBC
HCT: 33.7 — ABNORMAL LOW
MCV: 78.3
Platelets: 354
RDW: 17.8 — ABNORMAL HIGH

## 2011-10-05 LAB — WET PREP, GENITAL: Trich, Wet Prep: NONE SEEN

## 2011-10-05 LAB — POCT PREGNANCY, URINE
Operator id: 192351
Preg Test, Ur: POSITIVE

## 2012-02-07 ENCOUNTER — Encounter (HOSPITAL_COMMUNITY): Payer: Self-pay | Admitting: *Deleted

## 2012-02-07 ENCOUNTER — Emergency Department (INDEPENDENT_AMBULATORY_CARE_PROVIDER_SITE_OTHER)
Admission: EM | Admit: 2012-02-07 | Discharge: 2012-02-07 | Disposition: A | Discharge status: Home / Self Care | Payer: Self-pay | Source: Home / Self Care | Attending: Family Medicine | Admitting: Family Medicine

## 2012-02-07 DIAGNOSIS — J039 Acute tonsillitis, unspecified: Secondary | ICD-10-CM

## 2012-02-07 MED ORDER — METHYLPREDNISOLONE SODIUM SUCC 125 MG IJ SOLR
125.0000 mg | Freq: Once | INTRAMUSCULAR | Status: AC
  Administered 2012-02-07: 125 mg via INTRAMUSCULAR

## 2012-02-07 MED ORDER — CEFTRIAXONE SODIUM 1 G IJ SOLR
1.0000 g | Freq: Once | INTRAMUSCULAR | Status: AC
  Administered 2012-02-07: 1 g via INTRAMUSCULAR

## 2012-02-07 MED ORDER — IBUPROFEN 600 MG PO TABS: 600.0000 mg | ORAL_TABLET | Freq: Three times a day (TID) | ORAL | Status: AC | PRN

## 2012-02-07 MED ORDER — METHYLPREDNISOLONE SODIUM SUCC 125 MG IJ SOLR
INTRAMUSCULAR | Status: AC
  Filled 2012-02-07: qty 2

## 2012-02-07 MED ORDER — CEFTRIAXONE SODIUM 1 G IJ SOLR
INTRAMUSCULAR | Status: AC
  Filled 2012-02-07: qty 10

## 2012-02-07 MED ORDER — AMOXICILLIN 500 MG PO CAPS: 500.0000 mg | ORAL_CAPSULE | Freq: Three times a day (TID) | ORAL | Status: AC

## 2012-02-07 NOTE — ED Provider Notes (Signed)
History     CSN: [id number]  Arrival date & time 02/07/12  1659   First MD Initiated Contact with Patient 02/07/12 1846      Chief Complaint  Patient presents with  . Sore Throat    (Consider location/radiation/quality/duration/timing/severity/associated sxs/prior treatment) HPI Comments: 36 year old smoker female history of obesity and low hemoglobin, here complaining of sore throat and bilateral ear pain for 1 week worsening in the last 2 days. Today pain with swallowing solids but able to drink fluids, denies fever abdominal pain or chest pain no headache or vomiting, no rash. Son had similar symptoms and was treated with antibiotics in another facility. Denies cough or nasal congestion.    Past Medical History  Diagnosis Date  . DVT (deep venous thrombosis)     also had an infection in left ankle at same time.  . Anemia   . Obesity     History reviewed. No pertinent past surgical history.  Family History  Problem Relation Age of Onset  . COPD Mother   . Hypertension Mother   . Drug abuse Father     died of drug overdose    History  Substance Use Topics  . Smoking status: Current Everyday Smoker -- 0.8 packs/day    Types: Cigarettes  . Smokeless tobacco: Not on file  . Alcohol Use: No    OB History    Grav Para Term Preterm Abortions TAB SAB Ect Mult Living                  Review of Systems  Constitutional: Negative for fever and chills.  HENT: Positive for ear pain, congestion, sore throat, trouble swallowing and voice change. Negative for neck pain.   Respiratory: Negative for cough.   Cardiovascular: Negative for chest pain.  Gastrointestinal: Negative for nausea, vomiting, abdominal pain and diarrhea.  Musculoskeletal: Negative for myalgias, joint swelling and arthralgias.  Skin: Negative for rash.  Neurological: Positive for headaches.    Allergies  Review of patient's allergies indicates no known allergies.  Home Medications   Current  Outpatient Rx  Name Route Sig Dispense Refill  . AMOXICILLIN 500 MG PO CAPS Oral Take 1 capsule (500 mg total) by mouth 3 (three) times daily. 30 capsule 0  . BUPROPION HCL ER (SMOKING DET) 150 MG PO TB12 Oral Take 150 mg by mouth 2 (two) times daily.      Marland Kitchen ESOMEPRAZOLE MAGNESIUM 40 MG PO CPDR Oral Take 40 mg by mouth every morning before breakfast.      . IBUPROFEN 600 MG PO TABS Oral Take 1 tablet (600 mg total) by mouth every 8 (eight) hours as needed for pain. 20 tablet 0  . MEDROXYPROGESTERONE ACETATE 150 MG/ML IM SUSP Intramuscular Inject 1 mL (150 mg total) into the muscle every 3 (three) months. 1 mL 12  . PRENATAL 27-0.8 MG PO TABS Oral Take 1 tablet by mouth daily.        BP 148/96  Pulse 78  Temp(Src) 99.2 F (37.3 C) (Oral)  Resp 18  SpO2 100%  LMP 02/02/2012  Physical Exam  Nursing note and vitals reviewed. Constitutional: She is oriented to person, place, and time. She appears well-developed and well-nourished. No distress.  HENT:  Head: Normocephalic and atraumatic.  Right Ear: External ear normal.  Left Ear: External ear normal.       Nasal Congestion with erythema and swelling of nasal turbinates, clear rhinorrhea. Significant pharyngeal erythema with swelling and exudative tonsils bilaterally. No  uvula deviation. No trismus. TM's with increased vascular markings and some dullness bilaterally no swelling or bulging   Eyes: Conjunctivae and EOM are normal. Pupils are equal, round, and reactive to light. Right eye exhibits no discharge. Left eye exhibits no discharge.  Neck: Normal range of motion. Neck supple. No thyromegaly present.       Bilateral mild to moderate enlarged submandibular tender lymph nodes.   Cardiovascular: Normal heart sounds.   Pulmonary/Chest: Breath sounds normal. No respiratory distress. She has no wheezes. She has no rales. She exhibits no tenderness.  Abdominal: Soft. There is no tenderness.       No hepato-splenomagaly  Lymphadenopathy:      She has cervical adenopathy.  Neurological: She is alert and oriented to person, place, and time.  Skin: No rash noted.    ED Course  Procedures (including critical care time)   Labs Reviewed  POCT RAPID STREP A ([facility] CARE ONLY)  LAB REPORT - SCANNED   No results found.   1. Tonsillitis with exudate       MDM  Exudative tonsillitis, negative rapid strep test. Decided to treat with rocephin 1g IMx1 and solumedrol 125mg  IM x1 here prescription for oral amoxicillin, ibuprofen. reccommended close follow up. Throat culture not performed.        Sharin Grave, MD 02/10/12 [id number]

## 2012-02-07 NOTE — ED Notes (Signed)
Pt  Reports  Symptoms        Of  sorethroat    And  Pain  r  Ear         Which  She  Reports  She  Has been  Dealing  With  For  About  1  Week      -  She  Is  Awake  As  Well  As  Alert and  Oriented  And appears  In no  Severe  Distress  She  Is  Masked  And  Is in a  pvt  Room

## 2012-06-05 ENCOUNTER — Ambulatory Visit (INDEPENDENT_AMBULATORY_CARE_PROVIDER_SITE_OTHER): Payer: Self-pay | Admitting: Family Medicine

## 2012-06-05 ENCOUNTER — Encounter: Payer: Self-pay | Admitting: Family Medicine

## 2012-06-05 VITALS — BP 137/75 | HR 106 | Temp 98.3°F | Ht 69.0 in | Wt 275.5 lb

## 2012-06-05 DIAGNOSIS — S39011A Strain of muscle, fascia and tendon of abdomen, initial encounter: Secondary | ICD-10-CM | POA: Insufficient documentation

## 2012-06-05 DIAGNOSIS — IMO0002 Reserved for concepts with insufficient information to code with codable children: Secondary | ICD-10-CM

## 2012-06-05 MED ORDER — ESOMEPRAZOLE MAGNESIUM 40 MG PO CPDR: 40.0000 mg | DELAYED_RELEASE_CAPSULE | Freq: Every day | ORAL | Status: DC

## 2012-06-05 NOTE — Patient Instructions (Signed)
I think you have pulled a muscle in your abdomen, or your stomach wall.  I want you to take two extra strength tylenol (two 500 mg tablets) three times a day for a week, this should help decrease any inflammation where you pulled the muscle.    Please come back if you are not feeling better in a week.

## 2012-06-05 NOTE — Assessment & Plan Note (Signed)
Reassured patient that she likely pulled a stomach muscle when she was sick on Friday night.  She may have had a viral gastroenteritis that caused the initial symptoms.  Ibuprofen did not help her and seemed to upset her stomach.  Will schedule tylenol x1 week, see pt instructions.

## 2012-06-05 NOTE — Progress Notes (Signed)
  Subjective:    Patient ID: Candace Ramirez, female    DOB: [birth year], 36 y.o.   MRN: [id number]  HPI  Candace Ramirez comes in for a pulling pain and knot in her stomach that started Saturday morning.  She says she was sick and vomited a few times Friday, and had some diarrhea Friday and Saturday.  She says that since then, she has felt some pain in her right lower quadrant, that is worse with walking.  She says she has been taking bed rest and it started to feel better yesterday (Monday).  Since Saturday, she has been having regular bowel movements, and no nausea or vomiting.  No fevers/chills/blood in stool. She has only taken ibuprofen for this pain, once, which she says did not help.   Review of Systems Pertinent Items in HPI.     Objective:   Physical Exam BP 137/75  Pulse 106  Temp(Src) 98.3 F (36.8 C) (Oral)  Ht 5\' 9"  (1.753 m)  Wt 275 lb 8 oz (124.966 kg)  BMI 40.68 kg/m2  LMP 05/19/2012 General appearance: alert, cooperative and no distress Abdomen: Normal bowel sounds, soft, obese.  Patient with some mild TTP in RLQ, no rebound or guarding.  Exam limited by obesity.  MSK: Patient has normal strength and sensation and ROM of back and lower extremities, but some pain in abdomen with strength testing of right leg. She also has pain when sitting up from supine position in abdomen.         Assessment & Plan:

## 2013-02-20 ENCOUNTER — Ambulatory Visit (INDEPENDENT_AMBULATORY_CARE_PROVIDER_SITE_OTHER): Payer: Self-pay | Admitting: Family Medicine

## 2013-02-20 ENCOUNTER — Other Ambulatory Visit (HOSPITAL_COMMUNITY)
Admission: RE | Admit: 2013-02-20 | Discharge: 2013-02-20 | Disposition: A | Discharge status: Home / Self Care | Payer: Self-pay | Source: Ambulatory Visit | Attending: Family Medicine | Admitting: Family Medicine

## 2013-02-20 VITALS — BP 111/63 | HR 99 | Ht 69.0 in | Wt 271.6 lb

## 2013-02-20 DIAGNOSIS — Z113 Encounter for screening for infections with a predominantly sexual mode of transmission: Secondary | ICD-10-CM | POA: Insufficient documentation

## 2013-02-20 DIAGNOSIS — N898 Other specified noninflammatory disorders of vagina: Secondary | ICD-10-CM

## 2013-02-20 DIAGNOSIS — N760 Acute vaginitis: Secondary | ICD-10-CM

## 2013-02-20 DIAGNOSIS — Z20828 Contact with and (suspected) exposure to other viral communicable diseases: Secondary | ICD-10-CM

## 2013-02-20 LAB — POCT WET PREP (WET MOUNT)

## 2013-02-20 NOTE — Patient Instructions (Addendum)
It was nice to meet you today.  We will call you if any of your test results are positive.   Come back and see your PCP as needed.

## 2013-02-20 NOTE — Assessment & Plan Note (Signed)
Pt would like to be tested for all STIs, minimal vag d/c.  Will call with results if positive.

## 2013-02-20 NOTE — Progress Notes (Signed)
S: Pt comes in today for SDA for STD check.  Patient reports she has recently found out that a partner of hers is gay and she wants to be tested for everything.  Would like both vaginal and blood testing done.  Feels like she is wet down there, no d/c in particular that has been noted.  No vaginal bleeding. LMP was 02/04/13.  Currently sexually active with 2 partners.  Occasionally uses condoms, but not all of the time.  No fevers, chills, weight loss, night sweats.  No known exposures with partners.    ROS: Per HPI  History  Smoking status  . Current Every Day Smoker -- 0.80 packs/day  . Types: Cigarettes  Smokeless tobacco  . Not on file    O:  Filed Vitals:   02/20/13 1532  BP: 111/63  Pulse: 99    Gen: NAD Abd: soft, NT GU: external genitalia normal without rash or lesions, no d/c noted in the vaginal canal, cervix normal in appearance without friability, no CMT, no adnexa fullness or tenderness     A/P: 37 y.o. female p/w concerns for STIs -See problem list -f/u in PRN

## 2013-02-21 ENCOUNTER — Encounter (HOSPITAL_COMMUNITY): Payer: Self-pay | Admitting: Emergency Medicine

## 2013-02-21 ENCOUNTER — Emergency Department (HOSPITAL_COMMUNITY): Payer: Self-pay

## 2013-02-21 ENCOUNTER — Emergency Department (HOSPITAL_COMMUNITY)
Admission: EM | Admit: 2013-02-21 | Discharge: 2013-02-21 | Disposition: A | Discharge status: Home / Self Care | Payer: Self-pay | Attending: Emergency Medicine | Admitting: Emergency Medicine

## 2013-02-21 DIAGNOSIS — Z3202 Encounter for pregnancy test, result negative: Secondary | ICD-10-CM | POA: Insufficient documentation

## 2013-02-21 DIAGNOSIS — Z86718 Personal history of other venous thrombosis and embolism: Secondary | ICD-10-CM | POA: Insufficient documentation

## 2013-02-21 DIAGNOSIS — M549 Dorsalgia, unspecified: Secondary | ICD-10-CM | POA: Insufficient documentation

## 2013-02-21 DIAGNOSIS — E669 Obesity, unspecified: Secondary | ICD-10-CM | POA: Insufficient documentation

## 2013-02-21 DIAGNOSIS — R059 Cough, unspecified: Secondary | ICD-10-CM | POA: Insufficient documentation

## 2013-02-21 DIAGNOSIS — R05 Cough: Secondary | ICD-10-CM | POA: Insufficient documentation

## 2013-02-21 DIAGNOSIS — Z862 Personal history of diseases of the blood and blood-forming organs and certain disorders involving the immune mechanism: Secondary | ICD-10-CM | POA: Insufficient documentation

## 2013-02-21 DIAGNOSIS — Z79899 Other long term (current) drug therapy: Secondary | ICD-10-CM | POA: Insufficient documentation

## 2013-02-21 DIAGNOSIS — R109 Unspecified abdominal pain: Secondary | ICD-10-CM | POA: Insufficient documentation

## 2013-02-21 DIAGNOSIS — F17200 Nicotine dependence, unspecified, uncomplicated: Secondary | ICD-10-CM | POA: Insufficient documentation

## 2013-02-21 LAB — URINALYSIS, ROUTINE W REFLEX MICROSCOPIC
Glucose, UA: NEGATIVE mg/dL
Ketones, ur: NEGATIVE mg/dL
Leukocytes, UA: NEGATIVE
Protein, ur: NEGATIVE mg/dL

## 2013-02-21 LAB — HIV ANTIBODY (ROUTINE TESTING W REFLEX): HIV: NONREACTIVE

## 2013-02-21 MED ORDER — CYCLOBENZAPRINE HCL 10 MG PO TABS: 10.0000 mg | ORAL_TABLET | Freq: Two times a day (BID) | ORAL | Status: DC | PRN

## 2013-02-21 MED ORDER — IBUPROFEN 600 MG PO TABS: 600.0000 mg | ORAL_TABLET | Freq: Four times a day (QID) | ORAL | Status: DC | PRN

## 2013-02-21 MED ORDER — TRAMADOL HCL 50 MG PO TABS: 50.0000 mg | ORAL_TABLET | Freq: Four times a day (QID) | ORAL | Status: DC | PRN

## 2013-02-21 MED ORDER — OXYCODONE-ACETAMINOPHEN 5-325 MG PO TABS
1.0000 | ORAL_TABLET | Freq: Once | ORAL | Status: AC
  Administered 2013-02-21: 1 via ORAL
  Filled 2013-02-21: qty 1

## 2013-02-21 NOTE — ED Notes (Signed)
Pt c/o 10/10 mid back pain x1 week.  Denies injury. Reports that pain has worsened overnight and reports limited movement.

## 2013-02-21 NOTE — ED Provider Notes (Signed)
History     CSN: [id number]  Arrival date & time 02/21/13  1045   First MD Initiated Contact with Patient 02/21/13 1153      Chief Complaint  Patient presents with  . Back Pain    (Consider location/radiation/quality/duration/timing/severity/associated sxs/prior treatment) HPI Candace Ramirez is a 37 y.o. female who presents to ED with complaints of back pain. States woke up about a week ago with pain. Worsening since. States had URI symptoms, coughing. Denies fever, chills, malaise. No pain radiating down legs. No abdominal pain. No problems with bowels or urine. No dysuria. Not taking any medications for this. No other complaints. Pt states movement makes pain worse. Nothing makes it better. Denies injury or prior similar pain.  Past Medical History  Diagnosis Date  . DVT (deep venous thrombosis)     also had an infection in left ankle at same time.  . Anemia   . Obesity     History reviewed. No pertinent past surgical history.  Family History  Problem Relation Age of Onset  . COPD Mother   . Hypertension Mother   . Drug abuse Father     died of drug overdose    History  Substance Use Topics  . Smoking status: Current Every Day Smoker -- 0.80 packs/day    Types: Cigarettes  . Smokeless tobacco: Not on file  . Alcohol Use: No    OB History   Grav Para Term Preterm Abortions TAB SAB Ect Mult Living                  Review of Systems  Constitutional: Negative for fever and chills.  HENT: Negative for neck pain and neck stiffness.   Respiratory: Negative.   Cardiovascular: Negative.   Gastrointestinal: Negative for nausea, vomiting, abdominal pain and diarrhea.  Genitourinary: Positive for flank pain.  Musculoskeletal: Positive for back pain.  Skin: Negative.   Neurological: Negative for weakness, numbness and headaches.    Allergies  Review of patient's allergies indicates no known allergies.  Home Medications   Current Outpatient Rx  Name  Route  Sig   Dispense  Refill  . buPROPion (ZYBAN) 150 MG 12 hr tablet   Oral   Take 150 mg by mouth 2 (two) times daily.           Marland Kitchen esomeprazole (NEXIUM) 40 MG capsule   Oral   Take 1 capsule (40 mg total) by mouth daily before breakfast.   30 capsule   11   . medroxyPROGESTERone (DEPO-PROVERA) 150 MG/ML injection   Intramuscular   Inject 1 mL (150 mg total) into the muscle every 3 (three) months.   1 mL   12   . Prenatal 27-0.8 MG TABS   Oral   Take 1 tablet by mouth daily.             BP 98/71  Pulse 85  Temp(Src) 97.5 F (36.4 C) (Oral)  Resp 16  SpO2 99%  LMP 02/04/2013  Physical Exam  Nursing note and vitals reviewed. Constitutional: She appears well-developed and well-nourished. No distress.  Cardiovascular: Normal rate, regular rhythm and normal heart sounds.   Pulmonary/Chest: Effort normal and breath sounds normal. No respiratory distress. She has no wheezes. She has no rales.  Abdominal: Soft. Bowel sounds are normal. She exhibits no distension. There is no tenderness. There is no rebound.  bilateral CVA tenderness  Musculoskeletal:  Tender over bilateral flank. No midline cervical, thoracic, or lumbar spine tenderness. no straight  leg raise pain.   Neurological:  5/5 and equal  lower extremity strength bilaterally. Patellar reflexes 2+   Skin: Skin is warm.    ED Course  Procedures (including critical care time)  Results for orders placed during the hospital encounter of 02/21/13  URINALYSIS, ROUTINE W REFLEX MICROSCOPIC      Result Value Range   Color, Urine YELLOW  YELLOW   APPearance CLOUDY (*) CLEAR   Specific Gravity, Urine 1.026  1.005 - 1.030   pH 6.0  5.0 - 8.0   Glucose, UA NEGATIVE  NEGATIVE mg/dL   Hgb urine dipstick NEGATIVE  NEGATIVE   Bilirubin Urine NEGATIVE  NEGATIVE   Ketones, ur NEGATIVE  NEGATIVE mg/dL   Protein, ur NEGATIVE  NEGATIVE mg/dL   Urobilinogen, UA 0.2  0.0 - 1.0 mg/dL   Nitrite NEGATIVE  NEGATIVE   Leukocytes, UA  NEGATIVE  NEGATIVE  PREGNANCY, URINE      Result Value Range   Preg Test, Ur NEGATIVE  NEGATIVE   Dg Chest 2 View  02/21/2013  *RADIOLOGY REPORT*  Clinical Data: Cold symptoms, back pain  CHEST - 2 VIEW  Comparison: None.  Findings: Cardiomediastinal silhouette is unremarkable.  No acute infiltrate or pleural effusion.  No pulmonary edema.  Bony thorax is unremarkable.  IMPRESSION: No active disease.   Original Report Authenticated By: Natasha Mead, M.D.       1. Back pain       MDM  Pt with mid bilateral back pain. Reproducible with palpation ad movement. PERC negative.  CXR negative. UA negative. Suspect pt's pain is musculoskeletal. Will try NSAIDs, heating pads, flexeril. Follow up with pcp.        Lottie Mussel, PA 02/21/13 [phone]

## 2013-02-22 ENCOUNTER — Encounter: Payer: Self-pay | Admitting: Family Medicine

## 2013-02-24 NOTE — ED Provider Notes (Signed)
Medical screening examination/treatment/procedure(s) were performed by non-physician practitioner and as supervising physician I was immediately available for consultation/collaboration.  Davey Limas J. Nyasia Baxley, MD 02/24/13 1340 

## 2013-05-09 ENCOUNTER — Encounter (HOSPITAL_COMMUNITY): Payer: Self-pay | Admitting: *Deleted

## 2013-05-09 ENCOUNTER — Inpatient Hospital Stay (HOSPITAL_COMMUNITY)
Admission: AD | Admit: 2013-05-09 | Discharge: 2013-05-09 | Disposition: A | Discharge status: Home / Self Care | Payer: Medicaid Other | Source: Ambulatory Visit | Attending: Obstetrics and Gynecology | Admitting: Obstetrics and Gynecology

## 2013-05-09 ENCOUNTER — Inpatient Hospital Stay (HOSPITAL_COMMUNITY): Payer: Medicaid Other

## 2013-05-09 DIAGNOSIS — R109 Unspecified abdominal pain: Secondary | ICD-10-CM | POA: Insufficient documentation

## 2013-05-09 DIAGNOSIS — O209 Hemorrhage in early pregnancy, unspecified: Secondary | ICD-10-CM | POA: Insufficient documentation

## 2013-05-09 DIAGNOSIS — O4690 Antepartum hemorrhage, unspecified, unspecified trimester: Secondary | ICD-10-CM

## 2013-05-09 HISTORY — DX: Benign neoplasm of connective and other soft tissue, unspecified: D21.9

## 2013-05-09 LAB — URINE MICROSCOPIC-ADD ON

## 2013-05-09 LAB — URINALYSIS, ROUTINE W REFLEX MICROSCOPIC
Leukocytes, UA: NEGATIVE
Nitrite: NEGATIVE
Protein, ur: NEGATIVE mg/dL
Specific Gravity, Urine: >1.03 — ABNORMAL HIGH (ref 1.005–1.030)
Urobilinogen, UA: 0.2 mg/dL (ref 0.0–1.0)

## 2013-05-09 LAB — ABO/RH: ABO/RH(D): O POS

## 2013-05-09 LAB — WET PREP, GENITAL: Trich, Wet Prep: NONE SEEN

## 2013-05-09 LAB — CBC
MCV: 75.4 fL — ABNORMAL LOW (ref 78.0–100.0)
Platelets: 269 10*3/uL (ref 150–400)
RBC: 4.02 MIL/uL (ref 3.87–5.11)
RDW: 18.1 % — ABNORMAL HIGH (ref 11.5–15.5)
WBC: 6.2 10*3/uL (ref 4.0–10.5)

## 2013-05-09 LAB — POCT PREGNANCY, URINE: Preg Test, Ur: POSITIVE — AB

## 2013-05-09 NOTE — MAU Note (Signed)
Pt states here for bleeding x5 weeks. Usually has regular menstrual cycles. Denies abnormal vaginal discharge. Is having spotting at present. Had regular cycle, and has continued to have spotting, brown old blood.

## 2013-05-09 NOTE — MAU Provider Note (Signed)
History     CSN: [id number]  Arrival date and time: 05/09/13 1104   First Provider Initiated Contact with Patient 05/09/13 1330      Chief Complaint  Patient presents with  . Vaginal Bleeding  . Back Pain   HPI Candace Ramirez is 37 y.o. [id number] [redacted]w[redacted]d weeks presenting with vaginal bleeding X 5 weeks.  States she is cramping.  Sexually active X 1 partner.  Not using contraception.  She was unaware she is pregnant.  LMP 04/05/13.  Does admit to nausea and vomiting.    Past Medical History  Diagnosis Date  . Medical history non-contributory   . Fibroid     Past Surgical History  Procedure Laterality Date  . No past surgeries      No family history on file.  History  Substance Use Topics  . Smoking status: Current Every Day Smoker -- 2.00 packs/day  . Smokeless tobacco: Not on file  . Alcohol Use: No    Allergies: No Known Allergies  Prescriptions prior to admission  Medication Sig Dispense Refill  . ranitidine (ZANTAC) 150 MG tablet Take 150 mg by mouth daily.        Review of Systems  Constitutional: Negative for fever and chills.  Gastrointestinal: Positive for nausea, vomiting and abdominal pain. Negative for diarrhea and constipation.  Genitourinary: Negative for dysuria, urgency and frequency.       + for vaginal bleeding  Neurological: Negative for headaches.   Physical Exam   Blood pressure 100/51, pulse 89, temperature 98 F (36.7 C), temperature source Oral, resp. rate 16, height 5\' 9"  (1.753 m), weight 279 lb 2 oz (126.61 kg), last menstrual period 04/05/2013.  Physical Exam  Constitutional: She is oriented to person, place, and time. She appears well-developed and well-nourished. No distress.  HENT:  Head: Normocephalic.  Neck: Normal range of motion.  Cardiovascular: Normal rate.   Respiratory: Effort normal.  GI: Soft. She exhibits no distension and no mass. There is no tenderness. There is no rebound and no guarding.  Genitourinary: There is no  rash, tenderness or lesion on the right labia. There is no rash, tenderness or lesion on the left labia. There is bleeding (small amount of reddish brown discharge.  Neg for active bleeding) around the vagina. No erythema around the vagina. No foreign body around the vagina. No vaginal discharge found.  Neurological: She is alert and oriented to person, place, and time.  Skin: Skin is warm and dry.  Psychiatric: She has a normal mood and affect. Her behavior is normal.   Results for orders placed during the hospital encounter of 05/09/13 (from the past 24 hour(s))  URINALYSIS, ROUTINE W REFLEX MICROSCOPIC     Status: Abnormal   Collection Time    05/09/13 11:12 AM      Result Value Range   Color, Urine YELLOW  YELLOW   APPearance CLEAR  CLEAR   Specific Gravity, Urine >1.030 (*) 1.005 - 1.030   pH 6.0  5.0 - 8.0   Glucose, UA NEGATIVE  NEGATIVE mg/dL   Hgb urine dipstick LARGE (*) NEGATIVE   Bilirubin Urine NEGATIVE  NEGATIVE   Ketones, ur NEGATIVE  NEGATIVE mg/dL   Protein, ur NEGATIVE  NEGATIVE mg/dL   Urobilinogen, UA 0.2  0.0 - 1.0 mg/dL   Nitrite NEGATIVE  NEGATIVE   Leukocytes, UA NEGATIVE  NEGATIVE  URINE MICROSCOPIC-ADD ON     Status: Abnormal   Collection Time    05/09/13 11:12 AM  Result Value Range   Squamous Epithelial / LPF FEW (*) RARE   WBC, UA 0-2  <3 WBC/hpf   RBC / HPF 0-2  <3 RBC/hpf   Bacteria, UA FEW (*) RARE   Crystals CA OXALATE CRYSTALS (*) NEGATIVE   Urine-Other AMORPHOUS URATES/PHOSPHATES    POCT PREGNANCY, URINE     Status: Abnormal   Collection Time    05/09/13 12:03 PM      Result Value Range   Preg Test, Ur POSITIVE (*) NEGATIVE  CBC     Status: Abnormal   Collection Time    05/09/13 12:08 PM      Result Value Range   WBC 6.2  4.0 - 10.5 K/uL   RBC 4.02  3.87 - 5.11 MIL/uL   Hemoglobin 8.8 (*) 12.0 - 15.0 g/dL   HCT 16.1 (*) 09.6 - 04.5 %   MCV 75.4 (*) 78.0 - 100.0 fL   MCH 21.9 (*) 26.0 - 34.0 pg   MCHC 29.0 (*) 30.0 - 36.0 g/dL    RDW 40.9 (*) 81.1 - 15.5 %   Platelets 269  150 - 400 K/uL  WET PREP, GENITAL     Status: Abnormal   Collection Time    05/09/13  1:40 PM      Result Value Range   Yeast Wet Prep HPF POC NONE SEEN  NONE SEEN   Trich, Wet Prep NONE SEEN  NONE SEEN   Clue Cells Wet Prep HPF POC FEW (*) NONE SEEN   WBC, Wet Prep HPF POC FEW (*) NONE SEEN  HCG, QUANTITATIVE, PREGNANCY     Status: Abnormal   Collection Time    05/09/13  1:47 PM      Result Value Range   hCG, Beta Chain, Quant, S 717 (*) <5 mIU/mL  ABO/RH     Status: None   Collection Time    05/09/13  1:47 PM      Result Value Range   ABO/RH(D) O POS     MAU Course  Procedures  GC/CHL culture to the lab  MDM  Assessment and Plan  A:  Bleeding in early pregnancy--[redacted]w[redacted]d byLMP      Ultrasound report-Slightly thickened endometrium no GS seen      P:Discuused labs and U/S results with the patient     Explained that IUG is not seen at this time--DD ectopic, failed pregnancy vs pregnancy too early to see by U/S       Return for Follow up BHCG in 48 hrs    Ectopic precautions given--patient to return for increased pain or bleeding.  Edel Rivero,EVE M 05/09/2013, 3:33 PM

## 2013-05-09 NOTE — Discharge Instructions (Signed)
Pelvic Rest Pelvic rest is sometimes recommended for women when:   The placenta is partially or completely covering the opening of the cervix (placenta previa).  There is bleeding between the uterine wall and the amniotic sac in the first trimester (subchorionic hemorrhage).  The cervix begins to open without labor starting (incompetent cervix, cervical insufficiency).  The labor is too early (preterm labor). HOME CARE INSTRUCTIONS  Do not have sexual intercourse, stimulation, or an orgasm.  Do not use tampons, douche, or put anything in the vagina.  Do not lift anything over 10 pounds (4.5 kg).  Avoid strenuous activity or straining your pelvic muscles. SEEK MEDICAL CARE IF:  You have any vaginal bleeding during pregnancy. Treat this as a potential emergency.  You have cramping pain felt low in the stomach (stronger than menstrual cramps).  You notice vaginal discharge (watery, mucus, or bloody).  You have a low, dull backache.  There are regular contractions or uterine tightening. SEEK IMMEDIATE MEDICAL CARE IF: You have vaginal bleeding and have placenta previa.  Document Released: 04/08/2011 Document Revised: 03/05/2012 Document Reviewed: 04/08/2011 [facility] Patient Information 2013 [place], [place].  Vaginal Bleeding During Pregnancy A small amount of bleeding from the vagina can happen anytime during pregnancy. Be sure to tell your doctor about all vaginal bleeding.  HOME CARE  Get plenty of rest and sleep.  Count the number of pads you use each day. Do not use tampons.  Save any tissue you pass for your doctor to see.  Do not exercise  Do not do any heavy lifting.  Avoid going up and down stairs. If you must climb stairs, go slowly.  Do not have sex (intercourse) or orgasms until approved by your doctor.  Do not douche.  Only take medicine as told by your doctor. Do not take aspirin.  Eat healthy.  Always keep your follow-up appointments. GET HELP  RIGHT AWAY IF:   You feel the baby moving less or not moving at all.  The bleeding gets worse.  You have very painful cramps or pain in your stomach or back.  You pass large clots or anything that looks like tissue.  You have a temperature by mouth above 102 F (38.9 C).  You feel very weak.  You have chills.  You feel dizzy or pass out (faint).  You have a gush of fluid from the vagina. MAKE SURE YOU:   Understand these instructions.  Will watch your condition.  Will get help right away if you are not doing well or get worse. Document Released: 09/20/2008 Document Revised: 03/05/2012 Document Reviewed: 11/17/2009 [facility] Patient Information 2013 [place], [place].

## 2013-05-10 LAB — GC/CHLAMYDIA PROBE AMP
CT Probe RNA: NEGATIVE
GC Probe RNA: NEGATIVE

## 2013-05-10 NOTE — MAU Provider Note (Signed)
Attestation of Attending Supervision of Advanced Practitioner (CNM/NP): Evaluation and management procedures were performed by the Advanced Practitioner under my supervision and collaboration.  I have reviewed the Advanced Practitioner's note and chart, and I agree with the management and plan.  Kery Haltiwanger 05/10/2013 6:23 AM

## 2013-05-11 ENCOUNTER — Inpatient Hospital Stay (HOSPITAL_COMMUNITY)
Admission: AD | Admit: 2013-05-11 | Discharge: 2013-05-11 | Disposition: A | Discharge status: Home / Self Care | Payer: Self-pay | Source: Ambulatory Visit | Attending: Obstetrics & Gynecology | Admitting: Obstetrics & Gynecology

## 2013-05-11 DIAGNOSIS — O0281 Inappropriate change in quantitative human chorionic gonadotropin (hCG) in early pregnancy: Secondary | ICD-10-CM

## 2013-05-11 DIAGNOSIS — O200 Threatened abortion: Secondary | ICD-10-CM | POA: Insufficient documentation

## 2013-05-11 DIAGNOSIS — O00109 Unspecified tubal pregnancy without intrauterine pregnancy: Secondary | ICD-10-CM | POA: Insufficient documentation

## 2013-05-11 MED ORDER — FERROUS SULFATE 325 (65 FE) MG PO TABS: 325.0000 mg | ORAL_TABLET | Freq: Two times a day (BID) | ORAL | Status: DC

## 2013-05-11 MED ORDER — OXYCODONE-ACETAMINOPHEN 5-325 MG PO TABS: 1.0000 | ORAL_TABLET | ORAL | Status: DC | PRN

## 2013-05-11 MED ORDER — MISOPROSTOL 200 MCG PO TABS: 200.0000 ug | ORAL_TABLET | ORAL | Status: DC

## 2013-05-11 NOTE — MAU Note (Signed)
Cytotec consent signed. DPoe, CNM and RN reviewed and discussed with pt. All questions answered.

## 2013-05-11 NOTE — MAU Provider Note (Signed)
Attestation of Attending Supervision of Advanced Practitioner (CNM/NP): Evaluation and management procedures were performed by the Advanced Practitioner under my supervision and collaboration.  I have reviewed the Advanced Practitioner's note and chart, and I agree with the management and plan.  HARRAWAY-SMITH, Kolt Mcwhirter 5:52 PM      

## 2013-05-11 NOTE — MAU Provider Note (Signed)
Chief Complaint: Follow-up BHCG      SUBJECTIVE HPI: Candace Ramirez is a 37 y.o. [id number] @[redacted]w[redacted]d  by LNMP who presents for follow up quantitative beta hCG. She was seen here on 04/09/2013 giving history of spotting and light bleeding continuously since LMP. Ultrasound done that day showed no gestational sac, slightly thickened endometrium, no adnexal mass. Her hemoglobin was 8.8. Her quantitative beta hCG was 717. She began having heavier bleeding yesterday and passed to tablespoon size clots, no noted tissue. Lighter bleeding now. She's having lower abdominal cramping which also started yesterday. Blood type O+  Past Medical History  Diagnosis Date  . DVT (deep venous thrombosis)     also had an infection in left ankle at same time.  . Anemia   . Obesity    OB History   Grav Para Term Preterm Abortions TAB SAB Ect Mult Living                 No past surgical history on file. History   Social History  . Marital Status: Single    Spouse Name: N/A    Number of Children: N/A  . Years of Education: N/A   Occupational History  . Not on file.   Social History Main Topics  . Smoking status: Current Every Day Smoker -- 0.80 packs/day    Types: Cigarettes  . Smokeless tobacco: Not on file  . Alcohol Use: No  . Drug Use: No  . Sexually Active: Yes     Comment: not using condoms, no birth control   Other Topics Concern  . Not on file   Social History Narrative   Unemployed, 5 children,  Single.    No current facility-administered medications on file prior to encounter.   Current Outpatient Prescriptions on File Prior to Encounter  Medication Sig Dispense Refill  . cyclobenzaprine (FLEXERIL) 10 MG tablet Take 1 tablet (10 mg total) by mouth 2 (two) times daily as needed for muscle spasms.  20 tablet  0  . esomeprazole (NEXIUM) 40 MG capsule Take 40 mg by mouth daily before breakfast.      . ibuprofen (ADVIL,MOTRIN) 200 MG tablet Take 200 mg by mouth every 6 (six) hours as  needed for pain.      Marland Kitchen ibuprofen (ADVIL,MOTRIN) 600 MG tablet Take 1 tablet (600 mg total) by mouth every 6 (six) hours as needed for pain.  30 tablet  0  . traMADol (ULTRAM) 50 MG tablet Take 1 tablet (50 mg total) by mouth every 6 (six) hours as needed for pain.  15 tablet  0   No Known Allergies  ROS: Pertinent items in HPI  OBJECTIVE Blood pressure 127/80, pulse 81, temperature 98 F (36.7 C), temperature source Oral, resp. rate 18, height 5\' 9"  (1.753 m), weight 278 lb 2 oz (126.157 kg). GENERAL: Well-developed, well-nourished female in no acute distress.  HEENT: Normocephalic HEART: normal rate RESP: normal effort ABDOMEN: Soft, non-tender EXTREMITIES: Nontender, no edema NEURO: Alert and oriented  LAB RESULTS Results for orders placed during the hospital encounter of 05/11/13 (from the past 24 hour(s))  HCG, QUANTITATIVE, PREGNANCY     Status: Abnormal   Collection Time    05/11/13  1:43 PM      Result Value Range   hCG, Beta Chain, Quant, S 402 (*) <5 mIU/mL    I  ASSESSMENT 1. 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 Probable miscarriage; ectopic not ruled out.  Fe deficient anemia  PLAN Discharge home with bleeding and ectopic precations D/W Dr. Erin Fulling rectal cytotec.    Medication List    TAKE these medications       cyclobenzaprine 10 MG tablet  Commonly known as:  FLEXERIL  Take 1 tablet (10 mg total) by mouth 2 (two) times daily as needed for muscle spasms.     esomeprazole 40 MG capsule  Commonly known as:  NEXIUM  Take 40 mg by mouth daily before breakfast.     ferrous sulfate 325 (65 FE) MG tablet  Commonly known as:  FERROUSUL  Take 1 tablet (325 mg total) by mouth 2 (two) times daily.     ibuprofen 200 MG tablet  Commonly known as:  ADVIL,MOTRIN  Take 200 mg by mouth every 6 (six) hours as needed for pain.     misoprostol 200 MCG tablet  Commonly known  as:  CYTOTEC  Take 1 tablet (200 mcg total) by mouth 1 day or 1 dose.     oxyCODONE-acetaminophen 5-325 MG per tablet  Commonly known as:  ROXICET  Take 1 tablet by mouth every 4 (four) hours as needed for pain.     traMADol 50 MG tablet  Commonly known as:  ULTRAM  Take 1 tablet (50 mg total) by mouth every 6 (six) hours as needed for pain.       Follow-up Information   Follow up with [facility]. Schedule an appointment as soon as possible for a visit in 1 week. (Someone from [facility] will call you with appointment in 1 wk)    Contact information:   [address] [place] [address] [phone]      Danae Orleans, CNM 05/11/2013  2:02 PM

## 2013-05-11 NOTE — MAU Note (Signed)
Pt here for repeat bhcg. States is bleeding more and was passing clots last pm.

## 2013-05-12 ENCOUNTER — Telehealth (HOSPITAL_COMMUNITY): Payer: Self-pay | Admitting: Obstetrics and Gynecology

## 2013-05-12 ENCOUNTER — Telehealth: Payer: Self-pay | Admitting: Advanced Practice Midwife

## 2013-05-12 NOTE — Telephone Encounter (Signed)
Returned call to pharmacy to verify dosing and administration instructions on Cytotec. Insert 4 tablets PR x 1.

## 2013-05-12 NOTE — Telephone Encounter (Signed)
Encounter opened in error

## 2013-05-14 ENCOUNTER — Encounter (HOSPITAL_COMMUNITY): Payer: Self-pay | Admitting: Emergency Medicine

## 2013-07-02 ENCOUNTER — Ambulatory Visit (INDEPENDENT_AMBULATORY_CARE_PROVIDER_SITE_OTHER): Payer: Medicaid Other | Admitting: *Deleted

## 2013-07-02 DIAGNOSIS — Z111 Encounter for screening for respiratory tuberculosis: Secondary | ICD-10-CM

## 2013-07-02 NOTE — Progress Notes (Signed)
PPD Placement note Candace Ramirez, 37 y.o. female is here today for placement of PPD test Reason for PPD test: work Pt taken PPD test before: yes Verified in allergy area and with patient that they are not allergic to the products PPD is made of (Phenol or Tween). Yes Is patient taking any oral or IV steroid medication now or have they taken it in the last month? no Has the patient ever received the BCG vaccine?: no Has the patient been in recent contact with anyone known or suspected of having active TB disease?: no      O: Alert and oriented in NAD. P:  PPD placed on 07/02/2013.  Patient advised to return for reading within 48-72 hours. Wyatt Haste, RN-BSN

## 2013-07-11 ENCOUNTER — Telehealth: Payer: Self-pay | Admitting: Family Medicine

## 2013-07-11 NOTE — Telephone Encounter (Signed)
Called pt back and informed that she would have to come for return visit and pay again. Appointments made. Wyatt Haste, RN-BSN

## 2013-07-11 NOTE — Telephone Encounter (Signed)
Pt had PPD test on 7/8 and didn't come back for the reading. She wants to know if she can come now to have it read or does she have to do the test again and if she has to have another test does she have to pay again. JW

## 2013-07-12 ENCOUNTER — Ambulatory Visit (INDEPENDENT_AMBULATORY_CARE_PROVIDER_SITE_OTHER): Payer: Self-pay | Admitting: *Deleted

## 2013-07-12 DIAGNOSIS — Z111 Encounter for screening for respiratory tuberculosis: Secondary | ICD-10-CM

## 2013-07-12 NOTE — Progress Notes (Signed)
PPD Placement note Candace Ramirez, 37 y.o. female is here today for placement of PPD test Reason for PPD test:work Pt taken PPD test before: yes Verified in allergy area and with patient that they are not allergic to the products PPD is made of (Phenol or Tween). Yes Is patient taking any oral or IV steroid medication now or have they taken it in the last month? no Has the patient ever received the BCG vaccine?: no Has the patient been in recent contact with anyone known or suspected of having active TB disease?: no      O: Alert and oriented in NAD. P:  PPD placed on 07/12/2013.  Patient advised to return for reading within 48-72 hours.

## 2013-07-15 ENCOUNTER — Encounter: Payer: Self-pay | Admitting: *Deleted

## 2013-07-15 ENCOUNTER — Ambulatory Visit: Payer: Self-pay | Admitting: *Deleted

## 2013-07-15 DIAGNOSIS — Z111 Encounter for screening for respiratory tuberculosis: Secondary | ICD-10-CM

## 2013-07-15 LAB — TB SKIN TEST: Induration: 0 mm

## 2013-07-15 NOTE — Progress Notes (Signed)
PPD Reading Note PPD read and results entered in EpicCare. Result: 0 mm induration. Interpretation: negative Elizabeth Shun Pletz, RN-BSN  

## 2013-08-07 ENCOUNTER — Ambulatory Visit (INDEPENDENT_AMBULATORY_CARE_PROVIDER_SITE_OTHER): Payer: Self-pay | Admitting: Family Medicine

## 2013-08-07 ENCOUNTER — Encounter: Payer: Self-pay | Admitting: Family Medicine

## 2013-08-07 ENCOUNTER — Ambulatory Visit: Payer: Self-pay

## 2013-08-07 VITALS — BP 122/57 | HR 88 | Temp 99.0°F | Ht 69.0 in | Wt 274.0 lb

## 2013-08-07 DIAGNOSIS — M542 Cervicalgia: Secondary | ICD-10-CM | POA: Insufficient documentation

## 2013-08-07 MED ORDER — CYCLOBENZAPRINE HCL 10 MG PO TABS: 10.0000 mg | ORAL_TABLET | Freq: Two times a day (BID) | ORAL | Status: DC | PRN

## 2013-08-07 MED ORDER — HYDROCODONE-ACETAMINOPHEN 10-325 MG PO TABS: 1.0000 | ORAL_TABLET | Freq: Three times a day (TID) | ORAL | Status: DC | PRN

## 2013-08-07 NOTE — Progress Notes (Signed)
Subjective:     Patient ID: Candace Ramirez, female   DOB: 10-10-1976, 37 y.o.   MRN: [id number]  HPI Neck Pain: Patient has complaint today of neck pain that started 2 weeks ago  on right side of her neck. She reports she can not turn her neck fully without pain. She feels knot in neck that she stated appeared about 2 days ago. She reports she has not been able to tilt her head that was and talk on the phone (because of side bending).  She thinks it all started by her sleeping on her neck incorrectly and has progressed. Now she feels pain all the way down her back and in the right side of her lower body, including her right leg feels numb and tingling at times. She currently is experiencing neck pain only and mild discomfort in her hip/leg. She has never had this before , no trauma to neck or back.  Review of Systems Negative, with the exception of above mentioned in HPI     Objective:   Physical Exam BP 122/57  Pulse 88  Temp(Src) 99 F (37.2 C) (Oral)  Ht 5\' 9"  (1.753 m)  Wt 274 lb (124.286 kg)  BMI 40.44 kg/m2  LMP 04/05/2013 Gen: NAD. Obese female.  Neck: No erythema or swelling. Not tender to palpation over spinous process of cervical region or tenderpoints of anterior, posterior or lateral neck. No TTP over posterior right shoulder tenderpoints. ROM normal in flexion and extension of neck. ROM normal with discomfort in sidebending and rotation of neck. Small area of tissue texture change over right posterior scalene/trapezius  (feels like a tenderpoint)  Skin:Multiple scars and hyperpigmented areas over her exposed skin. No rashes on neck.  Neuro: Normal gait.  Alert. Grossly intact. Msk strength 5/5 bilaterally UE/LE.

## 2013-08-07 NOTE — Patient Instructions (Signed)
Your pain sounds like a muscle spasm.this could be due to many reasons, we covered today. I will prescribe you a muscle relaxer to take to help relax that muscle and some temporary pain medications.  Performing some mild stretches for your neck would also help and avoiding the type of movements we discussed during your appointment.  If you are not feeling better with medications please get neck xray ordered. Follow up with Dr. Pollie Meyer in 1-2 weeks  Or sooner if needed.

## 2013-08-08 NOTE — Assessment & Plan Note (Addendum)
-   Unknown cause. Possibly muscle spasm in origin RIGHT  posterior scalene vs trapezius muscle. Her now back pain/leg pain does not fit the proper description of radiating pain from the neck. It could be from holding herself differently due to neck pain. Difficult to access since I could not physically reproduce pain with palpation. Movement seems to be the aggravating factor and only in the right side of the neck during my exam.  - Prescribed flexeril and Norco for acute relief.  - Does not appear to be cervical neck, but ordered a xray to be completed if medication does not improve her symptoms (3 days).  - Advised her to f/u with her PCP in 1-2 weeks or sooner if no improvement.

## 2013-09-06 ENCOUNTER — Encounter: Payer: Self-pay | Admitting: Family Medicine

## 2013-09-06 ENCOUNTER — Ambulatory Visit (INDEPENDENT_AMBULATORY_CARE_PROVIDER_SITE_OTHER): Payer: Medicaid Other | Admitting: Family Medicine

## 2013-09-06 VITALS — BP 114/86 | HR 80 | Temp 98.2°F | Ht 69.0 in | Wt 271.0 lb

## 2013-09-06 DIAGNOSIS — R109 Unspecified abdominal pain: Secondary | ICD-10-CM | POA: Insufficient documentation

## 2013-09-06 DIAGNOSIS — R10811 Right upper quadrant abdominal tenderness: Secondary | ICD-10-CM

## 2013-09-06 DIAGNOSIS — K3189 Other diseases of stomach and duodenum: Secondary | ICD-10-CM

## 2013-09-06 LAB — CBC
Hemoglobin: 9.9 g/dL — ABNORMAL LOW (ref 12.0–15.0)
MCHC: 30.2 g/dL (ref 30.0–36.0)
Platelets: 344 10*3/uL (ref 150–400)
RDW: 19 % — ABNORMAL HIGH (ref 11.5–15.5)

## 2013-09-06 LAB — COMPREHENSIVE METABOLIC PANEL
ALT: 11 U/L (ref 0–35)
AST: 13 U/L (ref 0–37)
Albumin: 3.6 g/dL (ref 3.5–5.2)
BUN: 6 mg/dL (ref 6–23)
Calcium: 8.8 mg/dL (ref 8.4–10.5)
Chloride: 108 mEq/L (ref 96–112)
Potassium: 4.3 mEq/L (ref 3.5–5.3)

## 2013-09-06 MED ORDER — ESOMEPRAZOLE MAGNESIUM 40 MG PO CPDR: 40.0000 mg | DELAYED_RELEASE_CAPSULE | Freq: Every day | ORAL | Status: DC

## 2013-09-06 MED ORDER — OXYCODONE-ACETAMINOPHEN 5-325 MG PO TABS: 1.0000 | ORAL_TABLET | ORAL | Status: DC | PRN

## 2013-09-06 MED ORDER — OMEPRAZOLE 20 MG PO CPDR: 20.0000 mg | DELAYED_RELEASE_CAPSULE | Freq: Every day | ORAL | Status: DC

## 2013-09-06 NOTE — Progress Notes (Signed)
Patient ID: DAYAN KREIS, female   DOB: Sep 04, 1976, 37 y.o.   MRN: [id number]  [facility] Amber M. Hairford, MD Phone: [phone]   Subjective: HPI: Patient is a 37 y.o. female presenting to clinic today for same day.  Abdominal Pain Patient complains of abdominal pain. The pain is described as sharp, and is 9/10 in intensity. The patient is experiencing RUQ pain with radiation to arm. Onset was 2 weeks ago. Symptoms have been unchanged. Aggravating factors: Movement.  Alleviating factors: none. Associated symptoms: nausea. The patient denies fever. Evaluated for knot in abdomen, which they state was a pulled muscle. Has history of GERD, but this is different. Has not been taking reflux medication. Her mother gave her Percocet last night which did help some.   History Reviewed: Daily smoker.  ROS: Please see HPI above.  Objective: Office vital signs reviewed. BP 114/86  Pulse 80  Temp(Src) 98.2 F (36.8 C) (Oral)  Ht 5\' 9"  (1.753 m)  Wt 271 lb (122.925 kg)  BMI 40 kg/m2  LMP 08/29/2013  Physical Examination:  General: Awake, alert. NAD HEENT: Atraumatic, normocephalic. MMM Pulm: CTAB, no wheezes Cardio: RRR, no murmurs appreciated Abdomen:+BS, soft, nondistended. TTP RUQ and paraumbilical. I do not appreciate "knot" as she described. No rebound or guarding   Extremities: No edema Neuro: Grossly intact  Assessment: 37 y.o. female abdominal pain  Plan: See Problem List and After Visit Summary

## 2013-09-06 NOTE — Assessment & Plan Note (Signed)
Unsure etiology but concerned about gallbladder etiology based on age, sex and location of pain. Will order outpatient ultrasound to evaluate. Con't PPI (Prilosec on formulary) and RTC if pain worsens. Pt agrees with plan.

## 2013-09-06 NOTE — Patient Instructions (Addendum)
We are going to evaluate you for causes of your pain, starting with blood work and an ultrasound.  I am concerned that your gallbladder is causing some of your pain.  I have given you a few pain pills to get you through, but this will not be a long term medication for you. Also, I have sent in your Nexium for you as well.  Please follow up next week if your pain does not improve.  Sherelle Castelli M. Joushua Dugar, M.D.

## 2013-09-10 ENCOUNTER — Ambulatory Visit (HOSPITAL_COMMUNITY)
Admission: RE | Admit: 2013-09-10 | Discharge: 2013-09-10 | Disposition: A | Discharge status: Home / Self Care | Payer: Medicaid Other | Source: Ambulatory Visit | Attending: Family Medicine | Admitting: Family Medicine

## 2013-09-10 ENCOUNTER — Telehealth: Payer: Self-pay | Admitting: *Deleted

## 2013-09-10 DIAGNOSIS — R109 Unspecified abdominal pain: Secondary | ICD-10-CM

## 2013-09-10 DIAGNOSIS — K469 Unspecified abdominal hernia without obstruction or gangrene: Secondary | ICD-10-CM

## 2013-09-10 DIAGNOSIS — K429 Umbilical hernia without obstruction or gangrene: Secondary | ICD-10-CM | POA: Insufficient documentation

## 2013-09-10 DIAGNOSIS — R1011 Right upper quadrant pain: Secondary | ICD-10-CM | POA: Insufficient documentation

## 2013-09-10 DIAGNOSIS — R10811 Right upper quadrant abdominal tenderness: Secondary | ICD-10-CM

## 2013-09-10 NOTE — Telephone Encounter (Signed)
I have made patient aware of hernia on ultrasound. Will order abd/pelvis CT scan for one day this week after 2:00pm.  Referral placed for general surgery. They will call her to schedule appointment. Pt agrees with plan.  Justyne Roell M. Meghan Warshawsky, M.D.

## 2013-09-10 NOTE — Telephone Encounter (Signed)
Radiology calling to inform of patient's U/S showing an incarcerated hernia, MD paged and informed, she will contact patient.

## 2013-09-11 NOTE — Telephone Encounter (Signed)
CT has been scheduled, patient informed.

## 2013-09-13 ENCOUNTER — Telehealth: Payer: Self-pay | Admitting: *Deleted

## 2013-09-13 ENCOUNTER — Ambulatory Visit (HOSPITAL_COMMUNITY)
Admission: RE | Admit: 2013-09-13 | Discharge: 2013-09-13 | Disposition: A | Discharge status: Home / Self Care | Payer: Medicaid Other | Source: Ambulatory Visit | Attending: Family Medicine | Admitting: Family Medicine

## 2013-09-13 DIAGNOSIS — K439 Ventral hernia without obstruction or gangrene: Secondary | ICD-10-CM | POA: Insufficient documentation

## 2013-09-13 DIAGNOSIS — K469 Unspecified abdominal hernia without obstruction or gangrene: Secondary | ICD-10-CM

## 2013-09-13 DIAGNOSIS — K449 Diaphragmatic hernia without obstruction or gangrene: Secondary | ICD-10-CM | POA: Insufficient documentation

## 2013-09-13 MED ORDER — IOHEXOL 300 MG/ML  SOLN
80.0000 mL | Freq: Once | INTRAMUSCULAR | Status: AC | PRN
  Administered 2013-09-13: 80 mL via INTRAVENOUS

## 2013-09-13 NOTE — Telephone Encounter (Signed)
Radiology with a call report on patients CT abd/pelvis

## 2013-09-13 NOTE — Telephone Encounter (Signed)
MD informed of report, will proceed with surgery consult

## 2013-09-18 ENCOUNTER — Encounter (INDEPENDENT_AMBULATORY_CARE_PROVIDER_SITE_OTHER): Payer: Self-pay | Admitting: General Surgery

## 2013-09-18 ENCOUNTER — Ambulatory Visit (INDEPENDENT_AMBULATORY_CARE_PROVIDER_SITE_OTHER): Payer: Medicaid Other | Admitting: General Surgery

## 2013-09-18 ENCOUNTER — Telehealth: Payer: Self-pay | Admitting: Family Medicine

## 2013-09-18 VITALS — BP 126/78 | HR 80 | Temp 98.2°F | Resp 18 | Ht 69.0 in | Wt 276.0 lb

## 2013-09-18 DIAGNOSIS — K429 Umbilical hernia without obstruction or gangrene: Secondary | ICD-10-CM

## 2013-09-18 HISTORY — DX: Umbilical hernia without obstruction or gangrene: K42.9

## 2013-09-18 NOTE — Progress Notes (Signed)
Patient ID: Candace Ramirez, female   DOB: August 20, 1976, 37 y.o.   MRN: [id number]  Chief Complaint  Patient presents with  . New Evaluation    hernia    HPI Candace Ramirez is a 37 y.o. female.  We're asked to see the patient in consultation by Dr. Mikel Cella to evaluate her for an umbilical hernia. The patient is a 38 year old black female who has been experiencing a crampy type pain in her central Abdomen for the last few months. She has had several episodes of nausea and vomiting. The pain seems to be getting worse. Over the last month or 2 she has also noticed a bulge at her bellybutton. She also has problems with constipation. She had a CT scan recently that showed an umbilical hernia with a 1.3 cm fascial defect and some omental fat within the hernia sac. HPI  Past Medical History  Diagnosis Date  . DVT (deep venous thrombosis)     also had an infection in left ankle at same time.  . Anemia   . Obesity   . Medical history non-contributory   . Fibroid     Past Surgical History  Procedure Laterality Date  . No past surgeries      Family History  Problem Relation Age of Onset  . COPD Mother   . Hypertension Mother   . Drug abuse Father     died of drug overdose    Social History History  Substance Use Topics  . Smoking status: Current Every Day Smoker -- 1.00 packs/day    Types: Cigarettes  . Smokeless tobacco: Not on file  . Alcohol Use: No    No Known Allergies  Current Outpatient Prescriptions  Medication Sig Dispense Refill  . omeprazole (PRILOSEC) 20 MG capsule Take 1 capsule (20 mg total) by mouth daily.  30 capsule  2  . cyclobenzaprine (FLEXERIL) 10 MG tablet Take 1 tablet (10 mg total) by mouth 2 (two) times daily as needed for muscle spasms.  30 tablet  0  . ferrous sulfate (FERROUSUL) 325 (65 FE) MG tablet Take 1 tablet (325 mg total) by mouth 2 (two) times daily.  60 tablet  1  . HYDROcodone-acetaminophen (NORCO) 10-325 MG per tablet Take 1 tablet by mouth  every 8 (eight) hours as needed for pain.  30 tablet  0  . ibuprofen (ADVIL,MOTRIN) 200 MG tablet Take 200 mg by mouth every 6 (six) hours as needed for pain.      . misoprostol (CYTOTEC) 200 MCG tablet Take 1 tablet (200 mcg total) by mouth 1 day or 1 dose.  4 tablet  0  . oxyCODONE-acetaminophen (ROXICET) 5-325 MG per tablet Take 1 tablet by mouth every 4 (four) hours as needed for pain.  15 tablet  0  . ranitidine (ZANTAC) 150 MG tablet Take 150 mg by mouth daily.      . traMADol (ULTRAM) 50 MG tablet Take 1 tablet (50 mg total) by mouth every 6 (six) hours as needed for pain.  15 tablet  0   No current facility-administered medications for this visit.    Review of Systems Review of Systems  Constitutional: Negative.   HENT: Negative.   Eyes: Negative.   Respiratory: Negative.   Cardiovascular: Negative.   Gastrointestinal: Positive for abdominal pain and constipation.  Endocrine: Negative.   Genitourinary: Negative.   Musculoskeletal: Negative.   Skin: Negative.   Allergic/Immunologic: Negative.   Neurological: Negative.   Hematological: Negative.   Psychiatric/Behavioral: Negative.  Blood pressure 126/78, pulse 80, temperature 98.2 F (36.8 C), resp. rate 18, height 5\' 9"  (1.753 m), weight 276 lb (125.193 kg), last menstrual period 09/02/2013.  Physical Exam Physical Exam  Constitutional: She is oriented to person, place, and time. She appears well-developed and well-nourished.  HENT:  Head: Normocephalic and atraumatic.  Eyes: Conjunctivae and EOM are normal. Pupils are equal, round, and reactive to light.  Neck: Normal range of motion. Neck supple.  Cardiovascular: Normal rate, regular rhythm and normal heart sounds.   Pulmonary/Chest: Effort normal and breath sounds normal.  Abdominal: Soft. Bowel sounds are normal.  There is a moderate sized umbilical hernia that reduces easily. It is tender to palpation.  Musculoskeletal: Normal range of motion.  Neurological:  She is alert and oriented to person, place, and time.  Skin: Skin is warm and dry.  Psychiatric: She has a normal mood and affect. Her behavior is normal.    Data Reviewed As above  Assessment    The patient has a moderate-sized symptomatic but reducible umbilical hernia. Because of the risk of incarceration and strangulation I think she would benefit from having this fixed. I have discussed with her in detail the risks and benefits of the operation to fix the hernia as well as some of the technical aspects including the use of mesh and she understands and wishes to proceed     Plan    Plan for umbilical hernia repair with mesh        TOTH III,PAUL S 09/18/2013, 3:55 PM

## 2013-09-18 NOTE — Telephone Encounter (Signed)
Attempted to call patient twice to let her know that abd CT has a stable mass in her pelvis. She is being evaluated by Surgery today for hernia, hopefully they will discuss this with her as well at that time. I will attempt to call her again later based on surgery recs.  Candace Ramirez M. Quentez Lober, M.D.

## 2013-09-18 NOTE — Patient Instructions (Signed)
Plan for umbilical hernia repair with mesh 

## 2013-10-01 ENCOUNTER — Encounter: Payer: Self-pay | Admitting: Family Medicine

## 2013-10-01 ENCOUNTER — Ambulatory Visit (INDEPENDENT_AMBULATORY_CARE_PROVIDER_SITE_OTHER): Payer: Medicaid Other | Admitting: Family Medicine

## 2013-10-01 VITALS — BP 137/83 | HR 108 | Temp 98.1°F | Wt 268.0 lb

## 2013-10-01 DIAGNOSIS — J069 Acute upper respiratory infection, unspecified: Secondary | ICD-10-CM

## 2013-10-01 NOTE — Progress Notes (Signed)
  Subjective:    Patient ID: Candace Ramirez, female    DOB: 1976-11-13, 37 y.o.   MRN: [id number]  HPI  37 year old F who presents with a cough. It is accompanied by fatigue and muscle ache. It is improving since yesterday. She denies fever, chills, chest pain, shortness of breath, nausea, vomiting or diarrhea. 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 Otherwise, she has no history of respiratory disease  Review of Systems  Constitutional: Positive for fatigue. Negative for fever and chills.  HENT: Positive for congestion and rhinorrhea. Negative for ear pain, sore throat and ear discharge.   Eyes: Negative for discharge.  Respiratory: Negative for shortness of breath.   Cardiovascular: Negative.   Gastrointestinal: Negative.        Objective:   Physical Exam  Constitutional: She appears well-developed and well-nourished.  obese  HENT:  Head: Normocephalic and atraumatic.  Eyes: Conjunctivae and EOM are normal. Pupils are equal, round, and reactive to light.  Neck: Normal range of motion. Neck supple.  Cardiovascular: Normal rate, regular rhythm and normal heart sounds.   Pulmonary/Chest: Effort normal and breath sounds normal. No respiratory distress.  Lymphadenopathy:    She has no cervical adenopathy.  Skin:  Numerous abrasions on her face which she claims are from picking her skin   BP 137/83  Pulse 108  Temp(Src) 98.1 F (36.7 C) (Oral)  Wt 268 lb (121.564 kg)  BMI 39.56 kg/m2  LMP 09/02/2013        Assessment & Plan:  Patient appears to be recovering from viral respiratory illness without evidence of respiratory distress or concerning sequelae. Patient will respond nicely to expectant management. Given precautions to return for shortness of breath or worsening fever.

## 2013-10-14 ENCOUNTER — Other Ambulatory Visit (INDEPENDENT_AMBULATORY_CARE_PROVIDER_SITE_OTHER): Payer: Self-pay | Admitting: *Deleted

## 2013-10-14 DIAGNOSIS — K429 Umbilical hernia without obstruction or gangrene: Secondary | ICD-10-CM

## 2013-10-14 MED ORDER — OXYCODONE-ACETAMINOPHEN 5-325 MG PO TABS: 1.0000 | ORAL_TABLET | ORAL | Status: DC | PRN

## 2013-10-16 ENCOUNTER — Telehealth (INDEPENDENT_AMBULATORY_CARE_PROVIDER_SITE_OTHER): Payer: Self-pay | Admitting: General Surgery

## 2013-10-16 ENCOUNTER — Other Ambulatory Visit (INDEPENDENT_AMBULATORY_CARE_PROVIDER_SITE_OTHER): Payer: Self-pay | Admitting: General Surgery

## 2013-10-16 DIAGNOSIS — K429 Umbilical hernia without obstruction or gangrene: Secondary | ICD-10-CM

## 2013-10-16 MED ORDER — PROMETHAZINE HCL 12.5 MG PO TABS: 12.5000 mg | ORAL_TABLET | Freq: Four times a day (QID) | ORAL | Status: DC | PRN

## 2013-10-16 NOTE — Telephone Encounter (Signed)
i sent in a prescription for phenergan

## 2013-10-16 NOTE — Telephone Encounter (Signed)
Candace Ramirez, friend and care-giver for pt, called for her this afternoon.  Pt has had nausea and vomiting all day (vomit x 3-4 so far.)  Pt heard in background and supplying information---denies fever, has normal post op pain.  Encouraged caregiver to offer Gatorade or gingerale, broths etc.  Pt wants Rx for nausea called in please (to [facility]  Please advise.

## 2013-10-28 ENCOUNTER — Encounter (INDEPENDENT_AMBULATORY_CARE_PROVIDER_SITE_OTHER): Payer: Medicaid Other | Admitting: General Surgery

## 2013-10-30 ENCOUNTER — Encounter (INDEPENDENT_AMBULATORY_CARE_PROVIDER_SITE_OTHER): Payer: Self-pay | Admitting: General Surgery

## 2014-04-08 ENCOUNTER — Inpatient Hospital Stay (HOSPITAL_COMMUNITY): Payer: Medicaid Other

## 2014-04-08 ENCOUNTER — Inpatient Hospital Stay (HOSPITAL_COMMUNITY)
Admission: AD | Admit: 2014-04-08 | Discharge: 2014-04-08 | Disposition: A | Discharge status: Home / Self Care | Payer: Medicaid Other | Source: Ambulatory Visit | Attending: Obstetrics & Gynecology | Admitting: Obstetrics & Gynecology

## 2014-04-08 ENCOUNTER — Encounter (HOSPITAL_COMMUNITY): Payer: Self-pay

## 2014-04-08 DIAGNOSIS — O99019 Anemia complicating pregnancy, unspecified trimester: Secondary | ICD-10-CM | POA: Insufficient documentation

## 2014-04-08 DIAGNOSIS — R109 Unspecified abdominal pain: Secondary | ICD-10-CM | POA: Insufficient documentation

## 2014-04-08 DIAGNOSIS — O2390 Unspecified genitourinary tract infection in pregnancy, unspecified trimester: Secondary | ICD-10-CM | POA: Insufficient documentation

## 2014-04-08 DIAGNOSIS — D649 Anemia, unspecified: Secondary | ICD-10-CM | POA: Insufficient documentation

## 2014-04-08 DIAGNOSIS — Z86718 Personal history of other venous thrombosis and embolism: Secondary | ICD-10-CM | POA: Insufficient documentation

## 2014-04-08 DIAGNOSIS — L02419 Cutaneous abscess of limb, unspecified: Secondary | ICD-10-CM | POA: Diagnosis not present

## 2014-04-08 DIAGNOSIS — N760 Acute vaginitis: Secondary | ICD-10-CM | POA: Insufficient documentation

## 2014-04-08 DIAGNOSIS — A499 Bacterial infection, unspecified: Secondary | ICD-10-CM | POA: Insufficient documentation

## 2014-04-08 DIAGNOSIS — O99330 Smoking (tobacco) complicating pregnancy, unspecified trimester: Secondary | ICD-10-CM | POA: Insufficient documentation

## 2014-04-08 DIAGNOSIS — B9689 Other specified bacterial agents as the cause of diseases classified elsewhere: Secondary | ICD-10-CM | POA: Insufficient documentation

## 2014-04-08 DIAGNOSIS — D259 Leiomyoma of uterus, unspecified: Secondary | ICD-10-CM

## 2014-04-08 DIAGNOSIS — N949 Unspecified condition associated with female genital organs and menstrual cycle: Secondary | ICD-10-CM | POA: Insufficient documentation

## 2014-04-08 DIAGNOSIS — L03119 Cellulitis of unspecified part of limb: Secondary | ICD-10-CM

## 2014-04-08 DIAGNOSIS — O3410 Maternal care for benign tumor of corpus uteri, unspecified trimester: Secondary | ICD-10-CM | POA: Insufficient documentation

## 2014-04-08 LAB — CBC
HCT: 32.6 % — ABNORMAL LOW (ref 36.0–46.0)
HEMOGLOBIN: 10.1 g/dL — AB (ref 12.0–15.0)
MCH: 23.9 pg — ABNORMAL LOW (ref 26.0–34.0)
MCHC: 31 g/dL (ref 30.0–36.0)
MCV: 77.3 fL — ABNORMAL LOW (ref 78.0–100.0)
Platelets: 270 10*3/uL (ref 150–400)
RBC: 4.22 MIL/uL (ref 3.87–5.11)
RDW: 18.2 % — ABNORMAL HIGH (ref 11.5–15.5)
WBC: 6.7 10*3/uL (ref 4.0–10.5)

## 2014-04-08 LAB — URINALYSIS, ROUTINE W REFLEX MICROSCOPIC
Bilirubin Urine: NEGATIVE
Glucose, UA: NEGATIVE mg/dL
Hgb urine dipstick: NEGATIVE
KETONES UR: NEGATIVE mg/dL
Leukocytes, UA: NEGATIVE
NITRITE: NEGATIVE
Protein, ur: NEGATIVE mg/dL
SPECIFIC GRAVITY, URINE: 1.015 (ref 1.005–1.030)
UROBILINOGEN UA: 0.2 mg/dL (ref 0.0–1.0)
pH: 7 (ref 5.0–8.0)

## 2014-04-08 LAB — WET PREP, GENITAL
Trich, Wet Prep: NONE SEEN
Yeast Wet Prep HPF POC: NONE SEEN

## 2014-04-08 LAB — POCT PREGNANCY, URINE: Preg Test, Ur: POSITIVE — AB

## 2014-04-08 LAB — HCG, QUANTITATIVE, PREGNANCY: HCG, BETA CHAIN, QUANT, S: 18618 m[IU]/mL — AB (ref <5)

## 2014-04-08 MED ORDER — ENOXAPARIN SODIUM 30 MG/0.3ML ~~LOC~~ SOLN: 40.0000 mg | SUBCUTANEOUS | Status: DC

## 2014-04-08 MED ORDER — METRONIDAZOLE 500 MG PO TABS: 500.0000 mg | ORAL_TABLET | Freq: Two times a day (BID) | ORAL | Status: DC

## 2014-04-08 NOTE — MAU Note (Signed)
Patient states she has had a positive home pregnancy test 2 weeks ago. States she has been having lower abdominal pain for about 2 months. Has nausea with occasional vomiting for about one week. Denies bleeding or discharge.

## 2014-04-08 NOTE — Discharge Instructions (Signed)
Bacterial Vaginosis Bacterial vaginosis is an infection of the vagina. It happens when too many of certain germs (bacteria) grow in the vagina. HOME CARE  Take your medicine as told by your doctor.  Finish your medicine even if you start to feel better.  Do not have sex until you finish your medicine and are better.  Tell your sex partner that you have an infection. They should see their doctor for treatment.  Practice safe sex. Use condoms. Have only one sex partner. GET HELP IF:  You are not getting better after 3 days of treatment.  You have more grey fluid (discharge) coming from your vagina than before.  You have more pain than before.  You have a fever. MAKE SURE YOU:   Understand these instructions.  Will watch your condition.  Will get help right away if you are not doing well or get worse. Document Released: 09/20/2008 Document Revised: 10/02/2013 Document Reviewed: 07/24/2013 [employer] Patient Information 2014 [employer].  

## 2014-04-08 NOTE — MAU Provider Note (Signed)
Attestation of Attending Supervision of Advanced Practitioner (CNM/NP): Evaluation and management procedures were performed by the Advanced Practitioner under my supervision and collaboration. I have reviewed the Advanced Practitioner's note and chart, and I agree with the management and plan.  Guss Bunde 10:39 PM

## 2014-04-08 NOTE — MAU Provider Note (Signed)
History     CSN: [id number]  Arrival date and time: 04/08/14 1553   First Provider Initiated Contact with Patient 04/08/14 1752      Chief Complaint  Patient presents with  . Possible Pregnancy  . Abdominal Pain  . Nausea  . Emesis   HPI  Pt is a 38 yo [id number] at [redacted]w[redacted]d wks IUP here with report of lower pelvic pain that started two weeks ago.  Pain is not radiated to one side.  Denies abnormal bleeding or discharge.    Past Medical History  Diagnosis Date  . DVT (deep venous thrombosis) 2003    also had an infection in left ankle at same time; not pregnant (lovenox/coumadin)  . Anemia   . Obesity   . Medical history non-contributory   . Fibroid     Past Surgical History  Procedure Laterality Date  . No past surgeries    . Hiatal hernia repair      Family History  Problem Relation Age of Onset  . COPD Mother   . Hypertension Mother   . Drug abuse Father     died of drug overdose    History  Substance Use Topics  . Smoking status: Current Every Day Smoker -- 1.00 packs/day    Types: Cigarettes  . Smokeless tobacco: Not on file  . Alcohol Use: No    Allergies: No Known Allergies  Prescriptions prior to admission  Medication Sig Dispense Refill  . ibuprofen (ADVIL,MOTRIN) 200 MG tablet Take 200 mg by mouth every 6 (six) hours as needed for pain.        Review of Systems  Constitutional: Negative for fever.  Respiratory: Negative for cough and shortness of breath.   Cardiovascular: Negative for chest pain and leg swelling.  Gastrointestinal: Positive for heartburn, nausea, vomiting and abdominal pain. Negative for diarrhea and constipation.  All other systems reviewed and are negative.  Physical Exam   Blood pressure 107/65, pulse 99, temperature 98.3 F (36.8 C), temperature source Oral, resp. rate 20, height 5\' 7"  (1.702 m), weight 264 lb 3.2 oz (119.84 kg), last menstrual period 03/12/2014, SpO2 100.00%.  Physical Exam  Constitutional: She is  oriented to person, place, and time. She appears well-developed and well-nourished. No distress.  HENT:  Head: Normocephalic.  Neck: Normal range of motion. Neck supple.  Cardiovascular: Normal rate, regular rhythm and normal heart sounds.   Respiratory: Effort normal and breath sounds normal. No respiratory distress.  GI: Soft. There is no tenderness. There is no rebound and no CVA tenderness.  Genitourinary: Uterus is enlarged ( palpates 8-10 wk size). Cervix exhibits no motion tenderness and no discharge. Vaginal discharge (white, creamy) found.  Musculoskeletal: Normal range of motion.  Neurological: She is alert and oriented to person, place, and time.  Skin: Skin is warm and dry.  Psychiatric: She has a normal mood and affect.    MAU Course  Procedures  Results for orders placed during the hospital encounter of 04/08/14 (from the past 24 hour(s))  URINALYSIS, ROUTINE W REFLEX MICROSCOPIC     Status: None   Collection Time    04/08/14  4:30 PM      Result Value Ref Range   Color, Urine YELLOW  YELLOW   APPearance CLEAR  CLEAR   Specific Gravity, Urine 1.015  1.005 - 1.030   pH 7.0  5.0 - 8.0   Glucose, UA NEGATIVE  NEGATIVE mg/dL   Hgb urine dipstick NEGATIVE  NEGATIVE   Bilirubin Urine  NEGATIVE  NEGATIVE   Ketones, ur NEGATIVE  NEGATIVE mg/dL   Protein, ur NEGATIVE  NEGATIVE mg/dL   Urobilinogen, UA 0.2  0.0 - 1.0 mg/dL   Nitrite NEGATIVE  NEGATIVE   Leukocytes, UA NEGATIVE  NEGATIVE  POCT PREGNANCY, URINE     Status: Abnormal   Collection Time    04/08/14  4:37 PM      Result Value Ref Range   Preg Test, Ur POSITIVE (*) NEGATIVE  CBC     Status: Abnormal   Collection Time    04/08/14  6:10 PM      Result Value Ref Range   WBC 6.7  4.0 - 10.5 K/uL   RBC 4.22  3.87 - 5.11 MIL/uL   Hemoglobin 10.1 (*) 12.0 - 15.0 g/dL   HCT 32.6 (*) 36.0 - 46.0 %   MCV 77.3 (*) 78.0 - 100.0 fL   MCH 23.9 (*) 26.0 - 34.0 pg   MCHC 31.0  30.0 - 36.0 g/dL   RDW 18.2 (*) 11.5 - 15.5  %   Platelets 270  150 - 400 K/uL  HCG, QUANTITATIVE, PREGNANCY     Status: Abnormal   Collection Time    04/08/14  6:10 PM      Result Value Ref Range   hCG, Beta Chain, Quant, [place] [address] (*) <5 mIU/mL  WET PREP, GENITAL     Status: Abnormal   Collection Time    04/08/14  6:16 PM      Result Value Ref Range   Yeast Wet Prep HPF POC NONE SEEN  NONE SEEN   Trich, Wet Prep NONE SEEN  NONE SEEN   Clue Cells Wet Prep HPF POC FEW (*) NONE SEEN   WBC, Wet Prep HPF POC FEW (*) NONE SEEN   Ultrasound: FINDINGS:  Intrauterine gestational sac: A single intrauterine gestational sac  is identified.  Yolk sac: Present  Embryo: Present  Cardiac Activity: Yes  Heart Rate: 115 bpm  CRL: 4 mm 6 w 1 d [place] EDC: 11/29/2014  Maternal uterus/adnexae: Overall enlarged, heterogeneous uterus.  Suspect small uterine fibroids versus adenomyosis. Minimal  visualization of the left ovary on the transabdominal images. The  right ovary is not visualized at all. No significant free fluid.  IMPRESSION:  1. Early viable single intrauterine pregnancy. The fetal heart rate  is 115 beats per min. By crown-rump length, the estimated  gestational age is 6 weeks 1 day. The estimated date of confinement  is November 29, 2014.  2. Enlarged, heterogeneous uterus. Suspect small uterine fibroids  versus adenomyosis.   Consulted with Dr. Gala Romney > reviewed HPI/OB HX/exam > get in [place], begin Lovenox 40 mg QD  Assessment and Plan  38 yo [id number] at [redacted]w[redacted]d wks IUP Uterine Fibroids Bacterial Vaginosis HX of Left DVT  Plan: Discharge to home Begin prenatal care ASAP RX Lovenox 40 mg SQ QD RX Flagyl 500 mg BID x 7 days  Serena Petterson N Muhammad 04/08/2014, 6:01 PM

## 2014-04-09 LAB — GC/CHLAMYDIA PROBE AMP
CT PROBE, AMP APTIMA: NEGATIVE
GC Probe RNA: NEGATIVE

## 2014-04-21 ENCOUNTER — Encounter: Payer: Medicaid Other | Admitting: Obstetrics & Gynecology

## 2014-05-01 ENCOUNTER — Encounter: Payer: Medicaid Other | Admitting: Family

## 2014-05-08 ENCOUNTER — Encounter: Payer: Medicaid Other | Admitting: Family Medicine

## 2014-05-13 ENCOUNTER — Inpatient Hospital Stay (HOSPITAL_COMMUNITY): Payer: Medicaid Other

## 2014-05-13 ENCOUNTER — Inpatient Hospital Stay (HOSPITAL_COMMUNITY)
Admission: AD | Admit: 2014-05-13 | Discharge: 2014-05-13 | Disposition: A | Discharge status: Home / Self Care | Payer: Medicaid Other | Source: Ambulatory Visit | Attending: Family Medicine | Admitting: Family Medicine

## 2014-05-13 ENCOUNTER — Encounter (HOSPITAL_COMMUNITY): Payer: Self-pay | Admitting: *Deleted

## 2014-05-13 DIAGNOSIS — O034 Incomplete spontaneous abortion without complication: Secondary | ICD-10-CM

## 2014-05-13 DIAGNOSIS — Z86718 Personal history of other venous thrombosis and embolism: Secondary | ICD-10-CM | POA: Insufficient documentation

## 2014-05-13 DIAGNOSIS — O071 Delayed or excessive hemorrhage following failed attempted termination of pregnancy: Secondary | ICD-10-CM | POA: Insufficient documentation

## 2014-05-13 DIAGNOSIS — F17200 Nicotine dependence, unspecified, uncomplicated: Secondary | ICD-10-CM | POA: Insufficient documentation

## 2014-05-13 LAB — CBC
HEMATOCRIT: 28.5 % — AB (ref 36.0–46.0)
HEMOGLOBIN: 8.7 g/dL — AB (ref 12.0–15.0)
MCH: 23.7 pg — ABNORMAL LOW (ref 26.0–34.0)
MCHC: 30.5 g/dL (ref 30.0–36.0)
MCV: 77.7 fL — ABNORMAL LOW (ref 78.0–100.0)
Platelets: 288 10*3/uL (ref 150–400)
RBC: 3.67 MIL/uL — ABNORMAL LOW (ref 3.87–5.11)
RDW: 19 % — ABNORMAL HIGH (ref 11.5–15.5)
WBC: 12.4 10*3/uL — ABNORMAL HIGH (ref 4.0–10.5)

## 2014-05-13 LAB — HCG, QUANTITATIVE, PREGNANCY: hCG, Beta Chain, Quant, S: 28113 m[IU]/mL — ABNORMAL HIGH (ref <5)

## 2014-05-13 MED ORDER — KETOROLAC TROMETHAMINE 60 MG/2ML IM SOLN
60.0000 mg | Freq: Once | INTRAMUSCULAR | Status: AC
  Administered 2014-05-13: 60 mg via INTRAMUSCULAR
  Filled 2014-05-13: qty 2

## 2014-05-13 MED ORDER — PROMETHAZINE HCL 25 MG PO TABS: 25.0000 mg | ORAL_TABLET | Freq: Four times a day (QID) | ORAL | Status: DC | PRN

## 2014-05-13 MED ORDER — OXYCODONE-ACETAMINOPHEN 5-325 MG PO TABS: 2.0000 | ORAL_TABLET | ORAL | Status: DC | PRN

## 2014-05-13 MED ORDER — MISOPROSTOL 200 MCG PO TABS: ORAL_TABLET | ORAL | Status: DC

## 2014-05-13 NOTE — MAU Note (Addendum)
Pt had abortion 3 days ago.  Pt states pain and vaginal bleeding is getting "worser as the days go by".  Pt reports changing her sanitary pad x 2 today.  Pt reports 4-5 half dollar sized clots.  Pt states she took a friends pain pill of unknown about 30 minutes prior to coming to hospital.

## 2014-05-13 NOTE — Discharge Instructions (Signed)
Incomplete Miscarriage A miscarriage is the sudden loss of an unborn baby (fetus) before the 20th week of pregnancy. In an incomplete miscarriage, parts of the fetus or placenta (afterbirth) remain in the body.  Having a miscarriage can be an emotional experience. Talk with your health care provider about any questions you may have about miscarrying, the grieving process, and your future pregnancy plans. CAUSES   Problems with the fetal chromosomes that make it impossible for the baby to develop normally. Problems with the baby's genes or chromosomes are most often the result of errors that occur by chance as the embryo divides and grows. The problems are not inherited from the parents.  Infection of the cervix or uterus.  Hormone problems.  Problems with the cervix, such as having an incompetent cervix. This is when the tissue in the cervix is not strong enough to hold the pregnancy.  Problems with the uterus, such as an abnormally shaped uterus, uterine fibroids, or congenital abnormalities.  Certain medical conditions.  Smoking, drinking alcohol, or taking illegal drugs.  Trauma. SYMPTOMS   Vaginal bleeding or spotting, with or without cramps or pain.  Pain or cramping in the abdomen or lower back.  Passing fluid, tissue, or blood clots from the vagina. DIAGNOSIS  Your health care provider will perform a physical exam. You may also have an ultrasound to confirm the miscarriage. Blood or urine tests may also be ordered. TREATMENT   Usually, a dilation and curettage (D&C) procedure is performed. During a D&C procedure, the cervix is widened (dilated) and any remaining fetal or placental tissue is gently removed from the uterus.  Antibiotic medicines are prescribed if there is an infection. Other medicines may be given to reduce the size of the uterus (contract) if there is a lot of bleeding.  If you have Rh negative blood and your baby was Rh positive, you will need an Rho(D)  immune globulin shot. This shot will protect any future baby from having Rh blood problems in future pregnancies.  You may be confined to bed rest. This means you should stay in bed and only get up to use the bathroom. HOME CARE INSTRUCTIONS   Rest as directed by your health care provider.  Restrict activity as directed by your health care provider. You may be allowed to continue light activity if curettage was not done but you require further treatment.  Keep track of the number of pads you use each day. Keep track of how soaked (saturated) they are. Record this information.  Do not  use tampons.  Do not douche or have sexual intercourse until approved by your health care provider.  Keep all follow-up appointments for re-evaluation and continuing management.  Only take over-the-counter or prescription medicines for pain, fever, or discomfort as directed by your health care provider.  Take antibiotic medicine as directed by your health care provider. Make sure you finish it even if you start to feel better. SEEK IMMEDIATE MEDICAL CARE IF:   You experience severe cramps in your stomach, back, or abdomen.  You have an unexplained temperature (make sure to record these temperatures).  You pass large clots or tissue (save these for your health care provider to inspect).  Your bleeding increases.  You become light-headed, weak, or have fainting episodes. MAKE SURE YOU:   Understand these instructions.  Will watch your condition.  Will get help right away if you are not doing well or get worse. Document Released: 12/12/2005 Document Revised: 10/02/2013 Document Reviewed: 07/11/2013  [employer] Patient Information 2014 [place].  Incomplete Miscarriage A miscarriage is the sudden loss of an unborn baby (fetus) before the 20th week of pregnancy. In an incomplete miscarriage, parts of the fetus or placenta (afterbirth) remain in the body.  Having a miscarriage can be an  emotional experience. Talk with your health care provider about any questions you may have about miscarrying, the grieving process, and your future pregnancy plans. CAUSES   Problems with the fetal chromosomes that make it impossible for the baby to develop normally. Problems with the baby's genes or chromosomes are most often the result of errors that occur by chance as the embryo divides and grows. The problems are not inherited from the parents.  Infection of the cervix or uterus.  Hormone problems.  Problems with the cervix, such as having an incompetent cervix. This is when the tissue in the cervix is not strong enough to hold the pregnancy.  Problems with the uterus, such as an abnormally shaped uterus, uterine fibroids, or congenital abnormalities.  Certain medical conditions.  Smoking, drinking alcohol, or taking illegal drugs.  Trauma. SYMPTOMS   Vaginal bleeding or spotting, with or without cramps or pain.  Pain or cramping in the abdomen or lower back.  Passing fluid, tissue, or blood clots from the vagina. DIAGNOSIS  Your health care provider will perform a physical exam. You may also have an ultrasound to confirm the miscarriage. Blood or urine tests may also be ordered. TREATMENT   Usually, a dilation and curettage (D&C) procedure is performed. During a D&C procedure, the cervix is widened (dilated) and any remaining fetal or placental tissue is gently removed from the uterus.  Antibiotic medicines are prescribed if there is an infection. Other medicines may be given to reduce the size of the uterus (contract) if there is a lot of bleeding.  If you have Rh negative blood and your baby was Rh positive, you will need an Rho(D) immune globulin shot. This shot will protect any future baby from having Rh blood problems in future pregnancies.  You may be confined to bed rest. This means you should stay in bed and only get up to use the bathroom. HOME CARE INSTRUCTIONS    Rest as directed by your health care provider.  Restrict activity as directed by your health care provider. You may be allowed to continue light activity if curettage was not done but you require further treatment.  Keep track of the number of pads you use each day. Keep track of how soaked (saturated) they are. Record this information.  Do not  use tampons.  Do not douche or have sexual intercourse until approved by your health care provider.  Keep all follow-up appointments for re-evaluation and continuing management.  Only take over-the-counter or prescription medicines for pain, fever, or discomfort as directed by your health care provider.  Take antibiotic medicine as directed by your health care provider. Make sure you finish it even if you start to feel better. SEEK IMMEDIATE MEDICAL CARE IF:   You experience severe cramps in your stomach, back, or abdomen.  You have an unexplained temperature (make sure to record these temperatures).  You pass large clots or tissue (save these for your health care provider to inspect).  Your bleeding increases.  You become light-headed, weak, or have fainting episodes. MAKE SURE YOU:   Understand these instructions.  Will watch your condition.  Will get help right away if you are not doing well or get worse. Document  Released: 12/12/2005 Document Revised: 10/02/2013 Document Reviewed: 07/11/2013 [facility] Patient Information 2014 [place], [place].

## 2014-05-13 NOTE — MAU Provider Note (Signed)
Attestation of Attending Supervision of Advanced Practitioner (PA/CNM/NP): Evaluation and management procedures were performed by the Advanced Practitioner under my supervision and collaboration.  I have reviewed the Advanced Practitioner's note and chart, and I agree with the management and plan.  Donnamae Jude, MD [facility] Attending 05/13/2014 9:43 PM

## 2014-05-13 NOTE — MAU Note (Signed)
Pt reprots she had a abortion 3 days ago. Was on lovenox up to tday before procedure and stopped taking it has not resumed medication. Pt c/o increased vaginal bleeding and pain .

## 2014-05-13 NOTE — MAU Provider Note (Signed)
History     CSN: [id number]  Arrival date and time: 05/13/14 1453   None     Chief Complaint  Patient presents with  . Vaginal Bleeding   HPIpt is G11P4 TAB 5 with hx of TAB in [place] on 5/16 @ 10w 6d GA.  Pt states she had initial pain and bleeding but has progressively gotten worse and has been passing half dollar size clots.  Pt was seen initially on 04/08/2014 with confirmation of pregnancy with IUP [redacted]w[redacted]d.  Pt was put on Lovenox at that time for hx of DVT in 2003. Pt has stopped taking Lovenox.   RN note: Pt reprots she had a abortion 3 days ago. Was on lovenox up to tday before procedure and stopped taking it has not resumed medication. Pt c/o increased vaginal bleeding and pain . Precious Gilding, RN Registered Nurse Addendum  MAU Note Service date: 05/13/2014 3:15 PM   Pt had abortion 3 days ago. Pt states pain and vaginal bleeding is getting "worser as the days go by". Pt reports changing her sanitary pad x 2 today. Pt reports 4-5 half dollar sized clots. Pt states she took a friends pain pill of unknown about 30 minutes prior to coming to hospital     Past Medical History  Diagnosis Date  . DVT (deep venous thrombosis) 2003    also had an infection in left ankle at same time; not pregnant (lovenox/coumadin)  . Anemia   . Obesity   . Medical history non-contributory   . Fibroid     Past Surgical History  Procedure Laterality Date  . No past surgeries    . Hiatal hernia repair      Family History  Problem Relation Age of Onset  . COPD Mother   . Hypertension Mother   . Drug abuse Father     died of drug overdose    History  Substance Use Topics  . Smoking status: Current Every Day Smoker -- 0.50 packs/day    Types: Cigarettes  . Smokeless tobacco: Not on file  . Alcohol Use: No    Allergies: No Known Allergies  Prescriptions prior to admission  Medication Sig Dispense Refill  . enoxaparin (LOVENOX) 30 MG/0.3ML injection Inject 0.4 mLs (40 mg total)  into the skin daily.  8.4 mL  1  . metroNIDAZOLE (FLAGYL) 500 MG tablet Take 1 tablet (500 mg total) by mouth 2 (two) times daily.  14 tablet  0    Review of Systems  Constitutional: Negative for fever and chills.  Gastrointestinal: Positive for abdominal pain and constipation. Negative for nausea and vomiting.  Genitourinary: Negative for dysuria.  Musculoskeletal: Positive for back pain.  Neurological: Negative for dizziness.   Physical Exam   Blood pressure 127/85, pulse 90, temperature 98 F (36.7 C), temperature source Oral, resp. rate 20, last menstrual period 03/12/2014, SpO2 100.00%.  Physical Exam  Nursing note and vitals reviewed. Constitutional: She is oriented to person, place, and time. She appears well-developed and well-nourished. No distress.  HENT:  Head: Normocephalic.  Eyes: Pupils are equal, round, and reactive to light.  Neck: Normal range of motion. Neck supple.  Cardiovascular: Normal rate.   Respiratory: Effort normal.  GI: Soft. There is no tenderness. There is no rebound and no guarding.  Genitourinary:  Os slightly open with some tissue obtained with ring forceps- bleeding is light to mod at this time  Musculoskeletal: Normal range of motion.  Neurological: She is alert and oriented to person,  place, and time.  Skin: Skin is warm and dry.  Pt has multiple dark hyperpigmented areas that pt says are due to pt picking at her skin due to ":nerves"  Psychiatric: She has a normal mood and affect.    MAU Course  Procedures Results for orders placed during the hospital encounter of 05/13/14 (from the past 24 hour(s))  CBC     Status: Abnormal   Collection Time    05/13/14  3:25 PM      Result Value Ref Range   WBC 12.4 (*) 4.0 - 10.5 K/uL   RBC 3.67 (*) 3.87 - 5.11 MIL/uL   Hemoglobin 8.7 (*) 12.0 - 15.0 g/dL   HCT 28.5 (*) 36.0 - 46.0 %   MCV 77.7 (*) 78.0 - 100.0 fL   MCH 23.7 (*) 26.0 - 34.0 pg   MCHC 30.5  30.0 - 36.0 g/dL   RDW 19.0 (*) 11.5 -  15.5 %   Platelets 288  150 - 400 K/uL   Results for orders placed during the hospital encounter of 05/13/14 (from the past 24 hour(s))  CBC     Status: Abnormal   Collection Time    05/13/14  3:25 PM      Result Value Ref Range   WBC 12.4 (*) 4.0 - 10.5 K/uL   RBC 3.67 (*) 3.87 - 5.11 MIL/uL   Hemoglobin 8.7 (*) 12.0 - 15.0 g/dL   HCT 28.5 (*) 36.0 - 46.0 %   MCV 77.7 (*) 78.0 - 100.0 fL   MCH 23.7 (*) 26.0 - 34.0 pg   MCHC 30.5  30.0 - 36.0 g/dL   RDW 19.0 (*) 11.5 - 15.5 %   Platelets 288  150 - 400 K/uL  HCG, QUANTITATIVE, PREGNANCY     Status: Abnormal   Collection Time    05/13/14  3:25 PM      Result Value Ref Range   hCG, Beta Chain, Quant, S 28113 (*) <5 mIU/mL   Radiologist called- states that pt has 5.7 cm endometrium with increased vascularity- heterogeneous C/w with retained POC US Transvaginal Non-ob  05/13/2014   CLINICAL DATA:  Abortion 3 days ago.  Heavy bleeding and cramping.  EXAM: TRANSVAGINAL ULTRASOUND OF PELVIS  TECHNIQUE: Transvaginal ultrasound examination of the pelvis was performed including evaluation of the uterus, ovaries, adnexal regions, and pelvic cul-de-sac.  COMPARISON:  04/08/2014  FINDINGS: Uterus  Measurements: 16.8 x 9.8 x 9.4 cm. No uterine mass visualized.  Endometrium  Thickness: 5.7 cm. Endometrium is diffusely thickened and heterogeneous with increased internal vascularity.  Right ovary  Not identified  Left ovary  Measurements: 2.5 x 2.2 x 1.9 cm. Normal appearance/no adnexal mass.  Other findings:  No free fluid  IMPRESSION: Thickened, heterogeneous, hypervascular endometrium, concerning for possible retained products of conception.  These results were called by telephone at the time of interpretation on 05/13/2014 at 4:32 PM to [facility] , who verbally acknowledged these results.   Electronically Signed   By: Logan Bores   On: 05/13/2014 16:32   Discussed with Dr. Kennon Rounds- will come evaluate pt. - reviewed ultrasound and pt- recommended  cytotec Toradol 60mg  IM given Assessment and Plan  Retained POC- cytotec 82mcg RX Phenergan 25 mg RX Percocet F/u in clinic May 26- need to discuss contraception Sooner if increase in bleeding or pain-discussed with pt  [place] Pugh 05/13/2014, 3:11 PM

## 2014-05-20 ENCOUNTER — Telehealth: Payer: Self-pay

## 2014-05-20 ENCOUNTER — Telehealth: Payer: Self-pay | Admitting: *Deleted

## 2014-05-20 ENCOUNTER — Other Ambulatory Visit: Payer: Medicaid Other

## 2014-05-20 NOTE — Telephone Encounter (Signed)
Patient did not come this afternoon for blood draw for 1 week f/u beta quant. Called patient-- states she forgot about it. Patient states she can come in tomorrow 05/21/14 at 1000. Informed patient it is extremely important she come to this appointment and I will put her on the schedule. Patient verbalized understanding. No questions or concerns.

## 2014-05-20 NOTE — Telephone Encounter (Signed)
Called patient to inform of missed lab appointment, spoke with patient and patient agrees to come later today.

## 2014-05-22 ENCOUNTER — Telehealth: Payer: Self-pay | Admitting: Obstetrics & Gynecology

## 2014-05-22 ENCOUNTER — Encounter: Payer: Self-pay | Admitting: Obstetrics & Gynecology

## 2014-05-22 NOTE — Telephone Encounter (Signed)
Called patient to inform her of missed appointment, and that she should reschedule. However, her phone voicemail is full, and I was not able to leave a message. I will send her a certified letter.

## 2014-05-27 ENCOUNTER — Encounter: Payer: Self-pay | Admitting: *Deleted

## 2014-06-10 ENCOUNTER — Encounter: Payer: Self-pay | Admitting: General Practice

## 2014-06-26 ENCOUNTER — Ambulatory Visit: Payer: Medicaid Other | Admitting: Family Medicine

## 2014-07-29 ENCOUNTER — Encounter: Payer: Self-pay | Admitting: Family Medicine

## 2014-07-29 ENCOUNTER — Ambulatory Visit (INDEPENDENT_AMBULATORY_CARE_PROVIDER_SITE_OTHER): Payer: Medicaid Other | Admitting: Family Medicine

## 2014-07-29 VITALS — BP 134/76 | HR 97 | Temp 97.8°F | Ht 67.0 in | Wt 254.6 lb

## 2014-07-29 DIAGNOSIS — R12 Heartburn: Secondary | ICD-10-CM

## 2014-07-29 DIAGNOSIS — R109 Unspecified abdominal pain: Secondary | ICD-10-CM

## 2014-07-29 MED ORDER — TRAMADOL HCL 50 MG PO TABS: 50.0000 mg | ORAL_TABLET | Freq: Three times a day (TID) | ORAL | Status: DC | PRN

## 2014-07-29 MED ORDER — PANTOPRAZOLE SODIUM 40 MG PO TBEC: 40.0000 mg | DELAYED_RELEASE_TABLET | Freq: Every day | ORAL | Status: DC

## 2014-07-29 NOTE — Assessment & Plan Note (Signed)
Likely GERD with cough and heartburn sensation with laying down at night for several months Start Protonix due to increased abdominal pain with cough.

## 2014-07-29 NOTE — Patient Instructions (Signed)
It's possible you have had a recurrence of your umbilical hernia, your smoking makes this more likely.   I recommend 1800 Quit now   Hernia A hernia occurs when an internal organ pushes out through a weak spot in the abdominal wall. Hernias most commonly occur in the groin and around the navel. Hernias often can be pushed back into place (reduced). Most hernias tend to get worse over time. Some abdominal hernias can get stuck in the opening (irreducible or incarcerated hernia) and cannot be reduced. An irreducible abdominal hernia which is tightly squeezed into the opening is at risk for impaired blood supply (strangulated hernia). A strangulated hernia is a medical emergency. Because of the risk for an irreducible or strangulated hernia, surgery may be recommended to repair a hernia. CAUSES   Heavy lifting.  Prolonged coughing.  Straining to have a bowel movement.  A cut (incision) made during an abdominal surgery. HOME CARE INSTRUCTIONS   Bed rest is not required. You may continue your normal activities.  Avoid lifting more than 10 pounds (4.5 kg) or straining.  Cough gently. If you are a smoker it is best to stop. Even the best hernia repair can break down with the continual strain of coughing. Even if you do not have your hernia repaired, a cough will continue to aggravate the problem.  Do not wear anything tight over your hernia. Do not try to keep it in with an outside bandage or truss. These can damage abdominal contents if they are trapped within the hernia sac.  Eat a normal diet.  Avoid constipation. Straining over long periods of time will increase hernia size and encourage breakdown of repairs. If you cannot do this with diet alone, stool softeners may be used. SEEK IMMEDIATE MEDICAL CARE IF:   You have a fever.  You develop increasing abdominal pain.  You feel nauseous or vomit.  Your hernia is stuck outside the abdomen, looks discolored, feels hard, or is  tender.  You have any changes in your bowel habits or in the hernia that are unusual for you.  You have increased pain or swelling around the hernia.  You cannot push the hernia back in place by applying gentle pressure while lying down. MAKE SURE YOU:   Understand these instructions.  Will watch your condition.  Will get help right away if you are not doing well or get worse. Document Released: 12/12/2005 Document Revised: 03/05/2012 Document Reviewed: 07/31/2008 [facility] Patient Information 2015 [place], [place]. This information is not intended to replace advice given to you by your health care provider. Make sure you discuss any questions you have with your health care provider.

## 2014-07-29 NOTE — Assessment & Plan Note (Addendum)
Similar pain to her previous hernia which hurts worse with coughing, pressure, and is relieved somewhat with a girdle. Greatest pain is actually at the site a few inches from her umbilicus, however she does have tenderness to palpation of her umbilicus with a small bulge, consistent with likely reducible hernia Treat pain with tramadol Discussed that i'm not trying to completely alleviate her pain but rather control it, will send back to surgery for reevaluation due to possible recurrence.  Explained she is at high risk for currents due to smoking, encouraged cessation Discussed red flags for incarceration, recommended fiber to regulate her bowel habits.

## 2014-07-29 NOTE — Progress Notes (Signed)
Patient ID: Candace Ramirez, female   DOB: 08/31/1976, 38 y.o.   MRN: [id number]  Kenn File, MD Phone: [phone]  Subjective:  Chief complaint-noted  Pt Here for abdominal pain.  Patient states that she's had gradually worsening abdominal pain in the last month. She states that it's very similar to her previous hernia pain. The pain is that both her umbilicus and at a site just above her umbilicus.  She states that it worsens with cough, pressure, bowel movements. She states that it developed after she had an abortion in May, she had prolonged episode of constipation after this and had several episodes is sitting and straining at stool..  Motrin has helped minimally, the girl that she wears helps some.  GERD Also states that she's had heartburn-like symptoms and cough that worsens with laying down at night for several months. The cough is making her abdominal pain much worse. She would like to take medication for this.  ROS-  Per history of present illness  Past Medical History Patient Active Problem List   Diagnosis Date Noted  . Cellulitis and abscess of leg 04/08/2014  . Umbilical hernia 90/24/0973  . Stomach pain 09/06/2013  . Neck pain on right side 08/07/2013  . Vaginal discharge 02/20/2013  . Abdominal muscle strain 06/05/2012  . Counseling on health promotion and disease prevention 08/09/2011  . Sexually transmitted disease exposure 08/09/2011  . Contraception management 08/09/2011  . Left ankle swelling 08/09/2011  . DOMESTIC ABUSE 01/11/2008  . ANEMIA 01/01/2008  . Anxiety state, unspecified 01/01/2008  . HEARTBURN 01/01/2008  . OBESITY, NOS 02/22/2007  . ANEMIA, IRON DEFICIENCY, UNSPEC. 02/22/2007  . TOBACCO DEPENDENCE 02/22/2007  . DEEP VEIN THROMBOPHLEBITIS, LEG 02/22/2007    Medications- reviewed and updated Current Outpatient Prescriptions  Medication Sig Dispense Refill  . ibuprofen (ADVIL,MOTRIN) 800 MG tablet Take 800 mg by mouth every 8  (eight) hours as needed for cramping.      . misoprostol (CYTOTEC) 200 MCG tablet Put 839mcg in vagina  4 tablet  0  . OVER THE COUNTER MEDICATION Take 1 tablet by mouth once. Used for pain.      Marland Kitchen oxyCODONE-acetaminophen (PERCOCET/ROXICET) 5-325 MG per tablet Take 2 tablets by mouth every 4 (four) hours as needed for severe pain.  15 tablet  0  . promethazine (PHENERGAN) 25 MG tablet Take 1 tablet (25 mg total) by mouth every 6 (six) hours as needed for nausea or vomiting.  12 tablet  0   No current facility-administered medications for this visit.    Objective: BP 134/76  Pulse 97  Temp(Src) 97.8 F (36.6 C) (Oral)  Ht 5\' 7"  (1.702 m)  Wt 254 lb 9.6 oz (115.486 kg)  BMI 39.87 kg/m2  LMP 03/12/2014 Gen: NAD, alert, cooperative with exam HEENT: NCAT CV: RRR, good S1/S2, no murmur Resp: CTABL, no wheezes, non-labored Abd: SNTND, no palpable defect or mass., Tenderness with a small bulge at the umbilicus, additional Focal area of tenderness to palpation around 10:00 about 4 inches from the umbilicus. No palpable defect at that area but I do feel an irregularity of the area with straining. Neuro: Alert and oriented, No gross deficits   Assessment/Plan:  Abdominal pain, unspecified site Similar pain to her previous hernia which hurts worse with coughing, pressure, and is relieved somewhat with a girdle. Greatest pain is actually at the site a few inches from her umbilicus, however she does have tenderness to palpation of her umbilicus with a small bulge, consistent with  likely reducible hernia Treat pain with tramadol Discussed that i'm not trying to completely alleviate her pain but rather control it, will send back to surgery for reevaluation due to possible recurrence.  Explained she is at high risk for currents due to smoking, encouraged cessation Discussed red flags for incarceration, recommended fiber to regulate her bowel habits.   HEARTBURN Likely GERD with cough and  heartburn sensation with laying down at night for several months Start Protonix due to increased abdominal pain with cough.

## 2014-08-15 ENCOUNTER — Encounter (INDEPENDENT_AMBULATORY_CARE_PROVIDER_SITE_OTHER): Payer: Medicaid Other | Admitting: General Surgery

## 2014-08-20 ENCOUNTER — Encounter (INDEPENDENT_AMBULATORY_CARE_PROVIDER_SITE_OTHER): Payer: Self-pay | Admitting: General Surgery

## 2014-10-16 ENCOUNTER — Telehealth: Payer: Self-pay | Admitting: *Deleted

## 2014-10-16 NOTE — Telephone Encounter (Signed)
Pt is requesting a referral to a have a sleep study.  Her friend gave her the number to [facility] but they need a referral. [place], Salome Spotted

## 2014-10-23 NOTE — Telephone Encounter (Signed)
Pt needs office visit to meet new PCP and discuss why she wants a sleep study. Please inform. Leeanne Rio, MD

## 2014-10-23 NOTE — Telephone Encounter (Signed)
LMOVM informing patient that she needed office visit to discuss.

## 2014-10-27 ENCOUNTER — Encounter: Payer: Self-pay | Admitting: Family Medicine

## 2014-10-28 ENCOUNTER — Encounter: Payer: Self-pay | Admitting: Family Medicine

## 2014-10-28 ENCOUNTER — Ambulatory Visit (INDEPENDENT_AMBULATORY_CARE_PROVIDER_SITE_OTHER): Payer: Medicaid Other | Admitting: Family Medicine

## 2014-10-28 VITALS — BP 122/84 | HR 81 | Temp 98.1°F | Ht 67.0 in | Wt 263.0 lb

## 2014-10-28 DIAGNOSIS — L731 Pseudofolliculitis barbae: Secondary | ICD-10-CM

## 2014-10-28 DIAGNOSIS — Z30013 Encounter for initial prescription of injectable contraceptive: Secondary | ICD-10-CM

## 2014-10-28 LAB — POCT URINE PREGNANCY: PREG TEST UR: NEGATIVE

## 2014-10-28 NOTE — Assessment & Plan Note (Signed)
No sign of infection. Patient reassured this is common with using razor or clippers to shave which she does. I recommended avoiding razor for now. Good hygiene recommended. Monitor for complete resolution. F/U soon if no improvement.

## 2014-10-28 NOTE — Progress Notes (Signed)
Subjective:     Patient ID: Candace Ramirez, female   DOB: 07-05-1976, 38 y.o.   MRN: [id number]  HPI  Contraception: Here to restart birth control, she was on Depo in the past, last used mor than 38yrs ago ,she stopped for no reason. Uses condoms. LMP; 10/24/14. G7P5 Boil:  C/O bumps on her Mons pubis area which she noticied 1 week ago, she has about 4 spots/bumps, painful, no discharge. No fever. Bump will last few days and resolve completely.  Current Outpatient Prescriptions on File Prior to Visit  Medication Sig Dispense Refill  . ibuprofen (ADVIL,MOTRIN) 800 MG tablet Take 800 mg by mouth every 8 (eight) hours as needed for cramping.    . misoprostol (CYTOTEC) 200 MCG tablet Put 852mcg in vagina 4 tablet 0  . OVER THE COUNTER MEDICATION Take 1 tablet by mouth once. Used for pain.    Marland Kitchen oxyCODONE-acetaminophen (PERCOCET/ROXICET) 5-325 MG per tablet Take 2 tablets by mouth every 4 (four) hours as needed for severe pain. 15 tablet 0  . pantoprazole (PROTONIX) 40 MG tablet Take 1 tablet (40 mg total) by mouth daily. 30 tablet 3  . promethazine (PHENERGAN) 25 MG tablet Take 1 tablet (25 mg total) by mouth every 6 (six) hours as needed for nausea or vomiting. 12 tablet 0  . traMADol (ULTRAM) 50 MG tablet Take 1 tablet (50 mg total) by mouth every 8 (eight) hours as needed. 30 tablet 0   No current facility-administered medications on file prior to visit.   Past Medical History  Diagnosis Date  . DVT (deep venous thrombosis) 2003    also had an infection in left ankle at same time; not pregnant (lovenox/coumadin)  . Anemia   . Obesity   . Medical history non-contributory   . Fibroid       Review of Systems  Respiratory: Negative.   Cardiovascular: Negative.   Gastrointestinal: Negative.   Genitourinary: Negative.   Skin:       Bump on mons pubis  All other systems reviewed and are negative.  Filed Vitals:   10/28/14 0949  BP: 122/84  Pulse: 81  Temp: 98.1 F (36.7 C)    TempSrc: Oral  Height: 5\' 7"  (1.702 m)  Weight: 263 lb (119.296 kg)       Objective:   Physical Exam  Constitutional: She appears well-developed. No distress.  Cardiovascular: Normal rate, regular rhythm, normal heart sounds and intact distal pulses.   No murmur heard. Pulmonary/Chest: Effort normal and breath sounds normal. No respiratory distress. She has no wheezes.  Abdominal: Soft. She exhibits no distension and no mass. There is no tenderness.  Skin:     Nursing note and vitals reviewed.      Assessment:     Contraception management Ingrown hair     Plan:     Check problem list.

## 2014-10-28 NOTE — Assessment & Plan Note (Addendum)
Pregnancy test negative. Depo shot given today by nursing staff. Recommended return for another depo in about 3 months. She agreed with plan.

## 2014-10-28 NOTE — Patient Instructions (Addendum)
It was nice seeing you, the bump on your pelvic area is due to ingrown hair which does not look infectious, I will recommend conservative treatment. Please return in 3 months for repeat depo shot,.  Ingrown Hair An ingrown hair is a hair that curls and re-enters the skin instead of growing straight out of the skin. It happens most often with curly hair. It is usually more severe in the neck area, but it can occur in any shaved area, including the beard area, groin, scalp, and legs. An ingrown hair may cause small pockets of infection. CAUSES  Shaving closely, tweezing, or waxing, especially curly hair. Using hair removal creams can sometimes lead to ingrown hairs, especially in the groin. SYMPTOMS   Small bumps on the skin. The bumps may be filled with pus.  Pain.  Itching. DIAGNOSIS  Your caregiver can usually tell what is wrong by doing a physical exam. TREATMENT  If there is a severe infection, your caregiver may prescribe antibiotic medicines. Laser hair removal may also be done to help prevent regrowth of the hair. HOME CARE INSTRUCTIONS   Do not shave irritated skin. You may start shaving again once the irritation has gone away.  If you are prone to ingrown hairs, consider not shaving as much as possible.  If antibiotics are prescribed, take them as directed. Finish them even if you start to feel better.  You may use a facial sponge in a gentle circular motion to help dislodge ingrown hairs on the face.  You may use a hair removal cream weekly, especially on the legs and underarms. Stop using the cream if it irritates your skin. Use caution when using hair removal creams in the groin area. SHAVING INSTRUCTIONS AFTER TREATMENT  Shower before shaving. Keep areas to be shaved packed in warm, moist wraps for several minutes before shaving. The warm, moist environment helps soften the hairs and makes ingrown hairs less likely to occur.  Use thick shaving gels.  Use a bump fighter  razor that cuts hair slightly above the skin level or use an electric shaver with a longer shave setting.  Shave in the direction of hair growth. Avoid making multiple razor strokes.  Use moisturizing lotions after shaving. Document Released: 03/20/2001 Document Revised: 06/12/2012 Document Reviewed: 03/13/2012 [facility] Patient Information 2015 [place], [place]. This information is not intended to replace advice given to you by your health care provider. Make sure you discuss any questions you have with your health care provider.

## 2015-01-14 ENCOUNTER — Other Ambulatory Visit (INDEPENDENT_AMBULATORY_CARE_PROVIDER_SITE_OTHER): Payer: Self-pay | Admitting: General Surgery

## 2015-01-14 DIAGNOSIS — Z9889 Other specified postprocedural states: Principal | ICD-10-CM

## 2015-01-14 DIAGNOSIS — Z8719 Personal history of other diseases of the digestive system: Secondary | ICD-10-CM

## 2015-01-16 ENCOUNTER — Other Ambulatory Visit: Payer: Medicaid Other

## 2015-01-23 ENCOUNTER — Other Ambulatory Visit: Payer: Medicaid Other

## 2015-01-27 ENCOUNTER — Ambulatory Visit
Admission: RE | Admit: 2015-01-27 | Discharge: 2015-01-27 | Disposition: A | Discharge status: Home / Self Care | Payer: Medicaid Other | Source: Ambulatory Visit | Attending: General Surgery | Admitting: General Surgery

## 2015-01-27 DIAGNOSIS — Z9889 Other specified postprocedural states: Principal | ICD-10-CM

## 2015-01-27 DIAGNOSIS — Z8719 Personal history of other diseases of the digestive system: Secondary | ICD-10-CM

## 2015-01-27 MED ORDER — IOHEXOL 300 MG/ML  SOLN
125.0000 mL | Freq: Once | INTRAMUSCULAR | Status: AC | PRN
  Administered 2015-01-27: 125 mL via INTRAVENOUS

## 2015-04-03 ENCOUNTER — Ambulatory Visit (INDEPENDENT_AMBULATORY_CARE_PROVIDER_SITE_OTHER): Payer: Medicaid Other | Admitting: Family Medicine

## 2015-04-03 ENCOUNTER — Encounter: Payer: Self-pay | Admitting: Family Medicine

## 2015-04-03 ENCOUNTER — Other Ambulatory Visit (HOSPITAL_COMMUNITY)
Admission: RE | Admit: 2015-04-03 | Discharge: 2015-04-03 | Disposition: A | Discharge status: Home / Self Care | Payer: Medicaid Other | Source: Ambulatory Visit | Attending: Family Medicine | Admitting: Family Medicine

## 2015-04-03 VITALS — BP 122/86 | HR 76 | Temp 98.0°F | Ht 67.0 in | Wt 268.0 lb

## 2015-04-03 DIAGNOSIS — Z113 Encounter for screening for infections with a predominantly sexual mode of transmission: Secondary | ICD-10-CM | POA: Insufficient documentation

## 2015-04-03 DIAGNOSIS — Z01419 Encounter for gynecological examination (general) (routine) without abnormal findings: Secondary | ICD-10-CM | POA: Diagnosis present

## 2015-04-03 DIAGNOSIS — K429 Umbilical hernia without obstruction or gangrene: Secondary | ICD-10-CM

## 2015-04-03 DIAGNOSIS — N920 Excessive and frequent menstruation with regular cycle: Secondary | ICD-10-CM | POA: Diagnosis present

## 2015-04-03 DIAGNOSIS — N921 Excessive and frequent menstruation with irregular cycle: Secondary | ICD-10-CM

## 2015-04-03 DIAGNOSIS — N760 Acute vaginitis: Secondary | ICD-10-CM | POA: Insufficient documentation

## 2015-04-03 DIAGNOSIS — Z124 Encounter for screening for malignant neoplasm of cervix: Secondary | ICD-10-CM

## 2015-04-03 DIAGNOSIS — Z1151 Encounter for screening for human papillomavirus (HPV): Secondary | ICD-10-CM | POA: Diagnosis present

## 2015-04-03 LAB — TSH: TSH: 1.446 u[IU]/mL (ref 0.350–4.500)

## 2015-04-03 LAB — POCT WET PREP (WET MOUNT): Clue Cells Wet Prep Whiff POC: NEGATIVE

## 2015-04-03 LAB — CBC
HCT: 34.3 % — ABNORMAL LOW (ref 36.0–46.0)
Hemoglobin: 10.6 g/dL — ABNORMAL LOW (ref 12.0–15.0)
MCH: 24.5 pg — ABNORMAL LOW (ref 26.0–34.0)
MCHC: 30.9 g/dL (ref 30.0–36.0)
MCV: 79.2 fL (ref 78.0–100.0)
MPV: 8.8 fL (ref 8.6–12.4)
Platelets: 274 10*3/uL (ref 150–400)
RBC: 4.33 MIL/uL (ref 3.87–5.11)
RDW: 17.9 % — AB (ref 11.5–15.5)
WBC: 4.4 10*3/uL (ref 4.0–10.5)

## 2015-04-03 LAB — POCT URINE PREGNANCY: Preg Test, Ur: NEGATIVE

## 2015-04-03 MED ORDER — TRAMADOL HCL 50 MG PO TABS: 50.0000 mg | ORAL_TABLET | Freq: Three times a day (TID) | ORAL | Status: DC | PRN

## 2015-04-03 NOTE — Progress Notes (Signed)
I was the preceptor on the day of this visit.   Shakeya Kerkman MD  

## 2015-04-03 NOTE — Patient Instructions (Signed)
Schedule an appointment in colposcopy clinic here to have a colposcopy and endometrial biopsy done. We are getting an ultrasound of your pelvis We are checking lab work today I will send you a letter or call you with your results  Please call your surgeon to discuss timing of surgery and to review your CT scan results I will prescribe you tramadol today to help with your hernia pain  Be well, Dr. Ardelia Mems

## 2015-04-03 NOTE — Assessment & Plan Note (Signed)
Large abdominal hernia seen on CT scan and on exam today. No signs of incarceration or strangulation at present. Hernia easily reducible, and belly soft and nontender to palpation. I recommend that she call her surgeon's office to discuss scheduling surgery. Advised she may need to go in for an office visit. I will give her tramadol for pain at this time. Discussed I will not prescribe anything stronger at this time.

## 2015-04-03 NOTE — Progress Notes (Signed)
Patient ID: Candace Ramirez, female   DOB: 1976/02/04, 39 y.o.   MRN: [id number]  HPI:  Note: Patient has been my primary patient since July 2013, but this is my first time seeing her in clinic.  Hernia pain: Last saw her surgeon Dr. Marlou Starks in January. Had CT scan done, patient states she was never told the results. She was supposed to be contacted about scheduling surgery, but has not heard from the surgery office. Has pain with working. Has been tried on ibuprofen, Tylenol, Vicodin, and tramadol. States these really give her any relief. Requesting something stronger for pain.  Vaginal bleeding: Started Depo-Provera on November 2. Reports she's been bleeding daily since then. She does have a definite period each month, but also bleeds in between. Has not had any pelvic pain. Reports that her blood is malodorous. She never came back to get her next Depo-Provera shot. Upon record review, patient had a TAB performed early 2015. She subsequently had heavy bleeding and was diagnosis retained products of conception. The [facility] clinic was following serial serum beta hCGs, but the patient did not follow-up to have these done. A urine pregnancy test done in November was negative prior to starting Depo-Provera. She is past due for a Pap smear. She does have a history of a DVT in the past.  ROS: See HPI.  [place]: Prior DVT, obesity, iron deficiency anemia, anxiety, current smoker, known umbilical hernia  PHYSICAL EXAM: BP 122/86 mmHg  Pulse 76  Temp(Src) 98 F (36.7 C) (Oral)  Ht 5\' 7"  (1.702 m)  Wt 268 lb (121.564 kg)  BMI 41.96 kg/m2  LMP 04/03/2015 Gen: NAD, pleasant, cooperative HEENT: NCAT Lungs: NWOB Neuro: grossly nonfocal speech normal Abdomen: Morbidly obese. Abdomen is soft, nontender to palpation. Periumbilical hernia palpable. Reducible without any signs of strangulation or incarceration. GU: Normal-appearing external genitalia. Small amount of blood present in vaginal vault.  Cervix with multiple whitish yellow nodules throughout cervix. No discharge seen from os. Nodules palpable on bimanual exam. No cervical motion tenderness. No adnexal masses or tenderness. Ext: Atraumatic  ASSESSMENT/PLAN:  Health maintenance:  -pap & std screening performed today  Menorrhagia with regular cycle Pelvic exam performed today to assess pelvic anatomy. She has multiple nodules on her cervix, which may represent nabothian cysts, but could also be indicative of premalignant changes. I did perform a Pap smear.  -I recommend that she follows up in colposcopy clinic to have her cervix evaluated further. I was unable to really see her full cervix as much as I would have liked as she had redundant vaginal tissue.  -She has risk factors for endometrial neoplasia as well, including obesity and cigarette smoking. She would benefit from an endometrial biopsy during her visit to colposcopy clinic. -I will schedule her a pelvic ultrasound prior to her colposcopy clinic appointment. -Check CBC and TSH today -GC, chlamydia, and Trichomonas added onto Pap smear today -Also check serum beta hCG today. Her urine pregnancy test is negative, but she never followed up for further beta hCG testing when she was supposed to a year ago. Want to ensure that this is truly negative.   Umbilical hernia Large abdominal hernia seen on CT scan and on exam today. No signs of incarceration or strangulation at present. Hernia easily reducible, and belly soft and nontender to palpation. I recommend that she call her surgeon's office to discuss scheduling surgery. Advised she may need to go in for an office visit. I will give her tramadol for  pain at this time. Discussed I will not prescribe anything stronger at this time.    STD screening: Also check HIV, RPR, and hepatitis panel with bloodwork today.  FOLLOW UP: F/u in a few weeks for colposcopy & endometrial biopsy Call surgeon's office for  appointment.  [place]. Ardelia Mems, [place]

## 2015-04-03 NOTE — Assessment & Plan Note (Signed)
Pelvic exam performed today to assess pelvic anatomy. She has multiple nodules on her cervix, which may represent nabothian cysts, but could also be indicative of premalignant changes. I did perform a Pap smear.  -I recommend that she follows up in colposcopy clinic to have her cervix evaluated further. I was unable to really see her full cervix as much as I would have liked as she had redundant vaginal tissue.  -She has risk factors for endometrial neoplasia as well, including obesity and cigarette smoking. She would benefit from an endometrial biopsy during her visit to colposcopy clinic. -I will schedule her a pelvic ultrasound prior to her colposcopy clinic appointment. -Check CBC and TSH today -GC, chlamydia, and Trichomonas added onto Pap smear today -Also check serum beta hCG today. Her urine pregnancy test is negative, but she never followed up for further beta hCG testing when she was supposed to a year ago. Want to ensure that this is truly negative.

## 2015-04-04 LAB — RPR

## 2015-04-04 LAB — HCG, QUANTITATIVE, PREGNANCY

## 2015-04-04 LAB — HIV ANTIBODY (ROUTINE TESTING W REFLEX): HIV: NONREACTIVE

## 2015-04-04 LAB — HEPATITIS PANEL, ACUTE
HCV AB: NEGATIVE
Hep A IgM: NONREACTIVE
Hep B C IgM: NONREACTIVE
Hepatitis B Surface Ag: NEGATIVE

## 2015-04-06 LAB — CYTOLOGY - PAP

## 2015-04-10 ENCOUNTER — Ambulatory Visit (HOSPITAL_COMMUNITY): Admission: RE | Admit: 2015-04-10 | Payer: Medicaid Other | Source: Ambulatory Visit

## 2015-04-10 ENCOUNTER — Encounter: Payer: Self-pay | Admitting: Family Medicine

## 2015-04-10 ENCOUNTER — Other Ambulatory Visit: Payer: Self-pay | Admitting: Family Medicine

## 2015-04-10 DIAGNOSIS — D649 Anemia, unspecified: Secondary | ICD-10-CM

## 2015-04-16 ENCOUNTER — Ambulatory Visit (INDEPENDENT_AMBULATORY_CARE_PROVIDER_SITE_OTHER): Payer: Medicaid Other | Admitting: Family Medicine

## 2015-04-16 ENCOUNTER — Encounter: Payer: Self-pay | Admitting: Family Medicine

## 2015-04-16 VITALS — BP 161/68 | HR 76 | Temp 97.7°F | Ht 67.0 in | Wt 276.1 lb

## 2015-04-16 VITALS — BP 141/84 | HR 86 | Temp 97.7°F | Ht 67.0 in | Wt 276.1 lb

## 2015-04-16 DIAGNOSIS — G5602 Carpal tunnel syndrome, left upper limb: Secondary | ICD-10-CM | POA: Diagnosis present

## 2015-04-16 DIAGNOSIS — N920 Excessive and frequent menstruation with regular cycle: Secondary | ICD-10-CM

## 2015-04-16 DIAGNOSIS — N889 Noninflammatory disorder of cervix uteri, unspecified: Secondary | ICD-10-CM | POA: Diagnosis not present

## 2015-04-16 DIAGNOSIS — Z3009 Encounter for other general counseling and advice on contraception: Secondary | ICD-10-CM | POA: Diagnosis present

## 2015-04-16 MED ORDER — WRIST SPLINT/COCK-UP/LEFT M MISC: Status: DC

## 2015-04-16 NOTE — Patient Instructions (Signed)
Nabothian Cyst  What Is It? Nabothian cysts are cysts filled with mucus that look like tiny bumps on the surface of the cervix. They are usually 2 millimeters to 10 millimeters in diameter, and they contain mucus that ranges in color from pale yellow to amber. In most cases, nabothian cysts occur when new tissue regrows on the cervix after childbirth. This new tissue blocks the openings of the cervix's nabothian glands, trapping their mucous secretion in tiny pockets under the skin. Nabothian cysts are a normal finding on the cervix of women who have had children. They also are seen in menopausal women whose cervical skin has thinned with age. Less often, nabothian cysts are related to chronic cervicitis, a long-term infection of the cervix. Nabothian cysts also are called nabothian follicles, epithelial inclusion cysts, and mucinous retention cysts. Symptoms Nabothian cysts do not cause any symptoms unless they become very large. Diagnosis In most cases, your doctor discovers that you have nabothian cysts during a routine gynecological exam. They almost always are considered to be normal. Rarely, when there is something unusual about the cyst's size or appearance, your doctor may do a procedure called a colposcopy, which uses a magnifying instrument to examine the surface of the cervix closely. If the cyst looks abnormal during this exam, your doctor may do a biopsy to check for rare forms of mucus-producing cancer. In a biopsy, a small piece of tissue from the cervix is removed and examined in a laboratory. Expected Duration Nabothian cysts are usually a long-term condition. Over time, some cysts may get bigger. Prevention Because nabothian cysts are considered to be normal, it is not necessary to prevent them. Treatment Usually, no treatment is needed. However, in some cases, your gynecologist may choose to remove the cyst. This can be done one of two ways: Electrocautery, which uses a heated probe to destroy  the cyst Cryotherapy, in which the gynecologist freezes the cyst with liquid nitrogen When To Call A Professional Because nabothian cysts are normal and do not cause any symptoms, you probably will not know they are there. Prognosis The prognosis is excellent.   Handout given for IUD. Please schedule appointment with PCP for IUD insertion.

## 2015-04-16 NOTE — Patient Instructions (Signed)
I think this is carpal tunnel syndrome Get cockup wrist splint and wear for 6 weeks Use ibuprofen as needed If not improving come back and see me in 6 weeks (sooner if getting worse or you have weakness) Schedule an appointment to get IUD.  Be well, Dr. Ardelia Mems     Carpal Tunnel Syndrome The carpal tunnel is a narrow area located on the palm side of your wrist. The tunnel is formed by the wrist bones and ligaments. Nerves, blood vessels, and tendons pass through the carpal tunnel. Repeated wrist motion or certain diseases may cause swelling within the tunnel. This swelling pinches the main nerve in the wrist (median nerve) and causes the painful hand and arm condition called carpal tunnel syndrome. CAUSES   Repeated wrist motions.  Wrist injuries.  Certain diseases like arthritis, diabetes, alcoholism, hyperthyroidism, and kidney failure.  Obesity.  Pregnancy. SYMPTOMS   A "pins and needles" feeling in your fingers or hand, especially in your thumb, index and middle fingers.  Tingling or numbness in your fingers or hand.  An aching feeling in your entire arm, especially when your wrist and elbow are bent for long periods of time.  Wrist pain that goes up your arm to your shoulder.  Pain that goes down into your palm or fingers.  A weak feeling in your hands. DIAGNOSIS  Your health care provider will take your history and perform a physical exam. An electromyography test may be needed. This test measures electrical signals sent out by your nerves into the muscles. The electrical signals are usually slowed by carpal tunnel syndrome. You may also need X-rays. TREATMENT  Carpal tunnel syndrome may clear up by itself. Your health care provider may recommend a wrist splint or medicine such as a nonsteroidal anti-inflammatory medicine. Cortisone injections may help. Sometimes, surgery may be needed to free the pinched nerve.  HOME CARE INSTRUCTIONS   Take all medicine as directed  by your health care provider. Only take over-the-counter or prescription medicines for pain, discomfort, or fever as directed by your health care provider.  If you were given a splint to keep your wrist from bending, wear it as directed. It is important to wear the splint at night. Wear the splint for as long as you have pain or numbness in your hand, arm, or wrist. This may take 1 to 2 months.  Rest your wrist from any activity that may be causing your pain. If your symptoms are work-related, you may need to talk to your employer about changing to a job that does not require using your wrist.  Put ice on your wrist after long periods of wrist activity.  Put ice in a plastic bag.  Place a towel between your skin and the bag.  Leave the ice on for 15-20 minutes, 03-04 times a day.  Keep all follow-up visits as directed by your health care provider. This includes any orthopedic referrals, physical therapy, and rehabilitation. Any delay in getting necessary care could result in a delay or failure of your condition to heal. SEEK IMMEDIATE MEDICAL CARE IF:   You have new, unexplained symptoms.  Your symptoms get worse and are not helped or controlled with medicines. MAKE SURE YOU:   Understand these instructions.  Will watch your condition.  Will get help right away if you are not doing well or get worse. Document Released: 12/09/2000 Document Revised: 04/28/2014 Document Reviewed: 10/28/2011 [facility] Patient Information 2015 [place], [place]. This information is not intended to replace advice  to you by your health care provider. Make sure you discuss any questions you have with your health care provider.  

## 2015-04-16 NOTE — Progress Notes (Signed)
Patient ID: Candace Ramirez, female   DOB: 1976/04/03, 39 y.o.   MRN: [id number] #1. Patient is here for evaluation of abnormal bimanual exam on recent well woman visit. Pap smear 04/06/2015. Negative for intraepithelial neoplasia. Transformation zone is present in the sample. Negative for high-risk HPV. Comments on the bimanual exam at that time revealed some nodularity of the cervix. #2. Patient has history of fairly irregular vaginal bleeding since her first Depo-Provera shot in November. Prior to that she had regular cycles. She is Re: Decided she does not want to do Depo-Provera for birth control.  Patient given informed consent, signed copy in the chart.  Placed in lithotomy position. Cervix viewed with speculum and colposcope after application of acetic acid.   Colposcopy adequate (entire squamocolumnar junctions seen  in entirety) ?  Yes Acetowhite lesions?No Punctation?No Mosaicism?  No Abnormal vasculature?  No Biopsies?No ECC?No  Complications? No Several nabothian cysts were seen on the posterior lip of the cervix one of these was fairly large but they were all classic in appearance and not concerning for any type of malignancy.   Patient was given post procedure instructions.  No restrictions.  ASSESSMENT: #1. Concern for nodularity on cervix noted on bimanual exam. Using colposcope today we determined these nodules are most consistent with benign normal-appearing the both insists. No follow-up necessary. Patient is informed and reassured. Her next Pap smear should be as scheduled. No interruption or advancement of her schedule according to [place] CP guidelines. #2. Irregular vaginal bleeding. This really seems to date from the time of her Depo-Provera shot. We reviewed her chart in some detail today. I do not think there's anything in his history that makes any concern for any type of endometrial hyperplasia or neoplastic event. I think watchful waiting is appropriate. She has not had  another Depo-Provera shot does not plan to. We did discuss other birth control options and she thinks she might want to proceed with IUD. We gave her handout on IUD and specifically on Mirena. Given her history of bleeding with the Depo-Provera shot, I would probably recommend the copper ParaGard IUD that I think she would benefit from either. Be happy see her back to insert that at her convenience. She is aware the right now she is not protected against pregnancy.

## 2015-04-19 NOTE — Progress Notes (Signed)
Patient ID: Candace Ramirez, female   DOB: 16-Feb-1976, 39 y.o.   MRN: [id number]  HPI:  Pt presents for a same day appointment to discuss left hand numbness  Has had numbness in L hand/arm for about 1 week. Started working a new housekeeping job about 2 weeks ago. Has tried tramadol with a little bit of relief. Numbness is located on flexor surface of wrist. Feels stiff and worse in AM. She is L handed. No hx o this in the past. No neck or shoulder pain.   ROS: See HPI  [place]: hx obesity, anemia, tobacco use, prior DVT, menorrhagia  PHYSICAL EXAM: BP 141/84 mmHg  Pulse 86  Temp(Src) 97.7 F (36.5 C) (Oral)  Ht 5\' 7"  (1.702 m)  Wt 276 lb 1.6 oz (125.238 kg)  BMI 43.23 kg/m2  LMP 04/03/2015 Gen: NAD, pleasant, cooperative HEENT: full ROM of neck, neg spurlings bilat Ext: full strength bilateral grip. No thenar atrophy either side. Full strength with wrist flexion & extension on L. No swelling or visible abnormalities. Negative tinels and phalens. 2+ radial pulses bilat. Sensation intact to light touch over entirety of hands/arms.  ASSESSMENT/PLAN:  1. Hand/wrist numbness: suspect may be early carpal tunnel syndrome. Recommend cockup splint to L hand nightly x 6 weeks. NSAIDs as needed. F/u in 6 weeks.  FOLLOW UP: F/u in 6 wks for wrist numbness  [place] J. Ardelia Mems, [place]

## 2015-04-20 NOTE — Progress Notes (Signed)
I was preceptor the day of this visit.   

## 2015-05-22 ENCOUNTER — Encounter (HOSPITAL_COMMUNITY): Payer: Self-pay | Admitting: Emergency Medicine

## 2015-05-22 ENCOUNTER — Emergency Department (HOSPITAL_COMMUNITY)
Admission: EM | Admit: 2015-05-22 | Discharge: 2015-05-22 | Disposition: A | Discharge status: Home / Self Care | Payer: Medicaid Other | Attending: Emergency Medicine | Admitting: Emergency Medicine

## 2015-05-22 DIAGNOSIS — Z86718 Personal history of other venous thrombosis and embolism: Secondary | ICD-10-CM | POA: Diagnosis not present

## 2015-05-22 DIAGNOSIS — E669 Obesity, unspecified: Secondary | ICD-10-CM | POA: Insufficient documentation

## 2015-05-22 DIAGNOSIS — Z86018 Personal history of other benign neoplasm: Secondary | ICD-10-CM | POA: Diagnosis not present

## 2015-05-22 DIAGNOSIS — Z720 Tobacco use: Secondary | ICD-10-CM | POA: Diagnosis not present

## 2015-05-22 DIAGNOSIS — R109 Unspecified abdominal pain: Secondary | ICD-10-CM | POA: Diagnosis present

## 2015-05-22 DIAGNOSIS — K429 Umbilical hernia without obstruction or gangrene: Secondary | ICD-10-CM | POA: Diagnosis not present

## 2015-05-22 DIAGNOSIS — Z90710 Acquired absence of both cervix and uterus: Secondary | ICD-10-CM | POA: Insufficient documentation

## 2015-05-22 DIAGNOSIS — Z862 Personal history of diseases of the blood and blood-forming organs and certain disorders involving the immune mechanism: Secondary | ICD-10-CM | POA: Diagnosis not present

## 2015-05-22 MED ORDER — TRAMADOL HCL 50 MG PO TABS: 50.0000 mg | ORAL_TABLET | Freq: Four times a day (QID) | ORAL | Status: DC | PRN

## 2015-05-22 NOTE — ED Notes (Signed)
Bed: WA02 Expected date:  Expected time:  Means of arrival:  Comments: EMS- 39yo F, abd pain/hernia

## 2015-05-22 NOTE — ED Provider Notes (Signed)
CSN: [id number]     Arrival date & time 05/22/15  1014 History   First MD Initiated Contact with Patient 05/22/15 1019     Chief Complaint  Patient presents with  . Abdominal Pain     (Consider location/radiation/quality/duration/timing/severity/associated sxs/prior Treatment) HPI Comments: Patient is a 39 year old female who presents with abdominal pain that started when she got out of bed this morning. The pain started suddenly and remained constant since the onset. The pain is "bulging" and severe and located just above her navel. No aggravating/alleviating factors. Since arriving in the ED patient reports her pain has resolved. She reports a previous history of an abdominal hernia that she had repaired. She thinks this is what is causing her pain. No other associated symptoms.   Patient is a 40 y.o. female presenting with abdominal pain.  Abdominal Pain Associated symptoms: no chest pain, no chills, no diarrhea, no dysuria, no fatigue, no fever, no nausea, no shortness of breath and no vomiting     Past Medical History  Diagnosis Date  . DVT (deep venous thrombosis) 2003    also had an infection in left ankle at same time; not pregnant (lovenox/coumadin)  . Anemia   . Obesity   . Medical history non-contributory   . Fibroid    Past Surgical History  Procedure Laterality Date  . No past surgeries    . Hiatal hernia repair    . Dilation and curettage of uterus     Family History  Problem Relation Age of Onset  . COPD Mother   . Hypertension Mother   . Drug abuse Father     died of drug overdose   History  Substance Use Topics  . Smoking status: Current Every Day Smoker -- 1.00 packs/day    Types: Cigarettes  . Smokeless tobacco: Not on file  . Alcohol Use: 0.0 oz/week    0 Standard drinks or equivalent per week     Comment: socially   OB History    Gravida Para Term Preterm AB TAB SAB Ectopic Multiple Living   12 5 4 1 6     5      Review of Systems   Constitutional: Negative for fever, chills and fatigue.  HENT: Negative for trouble swallowing.   Eyes: Negative for visual disturbance.  Respiratory: Negative for shortness of breath.   Cardiovascular: Negative for chest pain and palpitations.  Gastrointestinal: Positive for abdominal pain. Negative for nausea, vomiting and diarrhea.  Genitourinary: Negative for dysuria and difficulty urinating.  Musculoskeletal: Negative for arthralgias and neck pain.  Skin: Negative for color change.  Neurological: Negative for dizziness and weakness.  Psychiatric/Behavioral: Negative for dysphoric mood.      Allergies  Review of patient's allergies indicates no known allergies.  Home Medications   Prior to Admission medications   Medication Sig Start Date End Date Taking? Authorizing Provider  aspirin 325 MG tablet Take 325 mg by mouth every 6 (six) hours as needed for mild pain, moderate pain or headache.   Yes Historical Provider, MD  Elastic Bandages & Supports (WRIST SPLINT/COCK-UP/LEFT M) MISC Left cockup wrist splint. Wear nightly x 6 weeks. Patient may choose size. Patient not taking: Reported on 05/22/2015 04/16/15   Leeanne Rio, MD  pantoprazole (PROTONIX) 40 MG tablet Take 1 tablet (40 mg total) by mouth daily. Patient not taking: Reported on 05/22/2015 07/29/14   Timmothy Euler, MD  traMADol (ULTRAM) 50 MG tablet Take 1 tablet (50 mg total) by  mouth every 8 (eight) hours as needed. Patient not taking: Reported on 05/22/2015 04/03/15   Leeanne Rio, MD   BP 127/72 mmHg  Pulse 82  Temp(Src) 97.9 F (36.6 C) (Oral)  Resp 16  Ht 5\' 7"  (1.702 m)  Wt 270 lb (122.471 kg)  BMI 42.28 kg/m2  SpO2 99%  LMP 05/14/2015 (Approximate)  Breastfeeding? No Physical Exam  Constitutional: She is oriented to person, place, and time. She appears well-developed and well-nourished. No distress.  HENT:  Head: Normocephalic and atraumatic.  Eyes: Conjunctivae and EOM are normal.  Neck:  Normal range of motion.  Cardiovascular: Normal rate and regular rhythm.  Exam reveals no gallop and no friction rub.   No murmur heard. Pulmonary/Chest: Effort normal and breath sounds normal. She has no wheezes. She has no rales. She exhibits no tenderness.  Abdominal: Soft. She exhibits no distension. There is no tenderness. There is no rebound.  Reducible umbilical hernia noted just superior to the umbilicus. No focal tenderness or peritoneal signs.   Musculoskeletal: Normal range of motion.  Neurological: She is alert and oriented to person, place, and time. Coordination normal.  Speech is goal-oriented. Moves limbs without ataxia.   Skin: Skin is warm and dry.  Psychiatric: She has a normal mood and affect. Her behavior is normal.  Nursing note and vitals reviewed.   ED Course  Procedures (including critical care time) Labs Review Labs Reviewed - No data to display  Imaging Review No results found.   EKG Interpretation None      MDM   Final diagnoses:  Umbilical hernia without obstruction and without gangrene    11:05 AM Patient has a reducible umbilical hernia which I palpated and reduced. Patient is no longer in pain. Vitals stable and patient afebrile. Patient will be referred to general surgery and have prescription for pain medication.    Alvina Chou, PA-C 05/22/15 1154  Alfonzo Beers, MD 05/22/15 1158

## 2015-05-22 NOTE — ED Notes (Addendum)
Pt presents to ED via EMS C/O abd pain that started 3 hrs ago and stated she felt bulging. Pt stated no pain at this time, absent of bulge. Abd soft, denies tenderness upon palpation. Pt does have past hx of hernia

## 2015-05-22 NOTE — Discharge Instructions (Signed)
Take Tramadol as needed for pain. Refer to attached documents for more information. Follow up with Dr. Dalbert Batman for further evaluation.

## 2015-06-29 NOTE — Telephone Encounter (Signed)
Unable to determine any contact with this pt at this time

## 2015-07-21 ENCOUNTER — Encounter (HOSPITAL_COMMUNITY): Payer: Self-pay

## 2015-07-21 ENCOUNTER — Ambulatory Visit: Payer: Medicaid Other | Admitting: Family Medicine

## 2015-07-21 ENCOUNTER — Emergency Department (HOSPITAL_COMMUNITY): Payer: Medicaid Other

## 2015-07-21 ENCOUNTER — Emergency Department (HOSPITAL_COMMUNITY)
Admission: EM | Admit: 2015-07-21 | Discharge: 2015-07-21 | Disposition: A | Discharge status: Home / Self Care | Payer: Medicaid Other | Attending: Emergency Medicine | Admitting: Emergency Medicine

## 2015-07-21 DIAGNOSIS — Z86018 Personal history of other benign neoplasm: Secondary | ICD-10-CM | POA: Insufficient documentation

## 2015-07-21 DIAGNOSIS — Z3A13 13 weeks gestation of pregnancy: Secondary | ICD-10-CM | POA: Diagnosis not present

## 2015-07-21 DIAGNOSIS — Z8719 Personal history of other diseases of the digestive system: Secondary | ICD-10-CM | POA: Diagnosis not present

## 2015-07-21 DIAGNOSIS — Z3491 Encounter for supervision of normal pregnancy, unspecified, first trimester: Secondary | ICD-10-CM

## 2015-07-21 DIAGNOSIS — O99211 Obesity complicating pregnancy, first trimester: Secondary | ICD-10-CM | POA: Insufficient documentation

## 2015-07-21 DIAGNOSIS — R630 Anorexia: Secondary | ICD-10-CM | POA: Diagnosis not present

## 2015-07-21 DIAGNOSIS — Z862 Personal history of diseases of the blood and blood-forming organs and certain disorders involving the immune mechanism: Secondary | ICD-10-CM | POA: Diagnosis not present

## 2015-07-21 DIAGNOSIS — Z7982 Long term (current) use of aspirin: Secondary | ICD-10-CM | POA: Diagnosis not present

## 2015-07-21 DIAGNOSIS — E669 Obesity, unspecified: Secondary | ICD-10-CM | POA: Insufficient documentation

## 2015-07-21 DIAGNOSIS — R110 Nausea: Secondary | ICD-10-CM | POA: Diagnosis not present

## 2015-07-21 DIAGNOSIS — O9989 Other specified diseases and conditions complicating pregnancy, childbirth and the puerperium: Secondary | ICD-10-CM | POA: Diagnosis present

## 2015-07-21 DIAGNOSIS — Z86718 Personal history of other venous thrombosis and embolism: Secondary | ICD-10-CM | POA: Diagnosis not present

## 2015-07-21 DIAGNOSIS — Z3490 Encounter for supervision of normal pregnancy, unspecified, unspecified trimester: Secondary | ICD-10-CM

## 2015-07-21 DIAGNOSIS — Z720 Tobacco use: Secondary | ICD-10-CM | POA: Insufficient documentation

## 2015-07-21 DIAGNOSIS — R1031 Right lower quadrant pain: Secondary | ICD-10-CM | POA: Diagnosis not present

## 2015-07-21 HISTORY — DX: Diaphragmatic hernia without obstruction or gangrene: K44.9

## 2015-07-21 LAB — COMPREHENSIVE METABOLIC PANEL
ALBUMIN: 3.3 g/dL — AB (ref 3.5–5.0)
ALK PHOS: 55 U/L (ref 38–126)
ALT: 10 U/L — ABNORMAL LOW (ref 14–54)
AST: 14 U/L — ABNORMAL LOW (ref 15–41)
Anion gap: 7 (ref 5–15)
BUN: 9 mg/dL (ref 6–20)
CO2: 23 mmol/L (ref 22–32)
Calcium: 8.8 mg/dL — ABNORMAL LOW (ref 8.9–10.3)
Chloride: 111 mmol/L (ref 101–111)
Creatinine, Ser: 0.89 mg/dL (ref 0.44–1.00)
GFR calc Af Amer: >60 mL/min (ref >60)
GFR calc non Af Amer: >60 mL/min (ref >60)
Glucose, Bld: 104 mg/dL — ABNORMAL HIGH (ref 65–99)
POTASSIUM: 4 mmol/L (ref 3.5–5.1)
SODIUM: 141 mmol/L (ref 135–145)
TOTAL PROTEIN: 6.6 g/dL (ref 6.5–8.1)
Total Bilirubin: 0.4 mg/dL (ref 0.3–1.2)

## 2015-07-21 LAB — CBC WITH DIFFERENTIAL/PLATELET
BASOS ABS: 0 10*3/uL (ref 0.0–0.1)
BASOS PCT: 0 % (ref 0–1)
EOS ABS: 0 10*3/uL (ref 0.0–0.7)
Eosinophils Relative: 1 % (ref 0–5)
HCT: 36.6 % (ref 36.0–46.0)
HEMOGLOBIN: 10.8 g/dL — AB (ref 12.0–15.0)
LYMPHS ABS: 1.4 10*3/uL (ref 0.7–4.0)
LYMPHS PCT: 23 % (ref 12–46)
MCH: 24.3 pg — AB (ref 26.0–34.0)
MCHC: 29.5 g/dL — ABNORMAL LOW (ref 30.0–36.0)
MCV: 82.4 fL (ref 78.0–100.0)
MONO ABS: 0.4 10*3/uL (ref 0.1–1.0)
Monocytes Relative: 7 % (ref 3–12)
NEUTROS ABS: 4.4 10*3/uL (ref 1.7–7.7)
Neutrophils Relative %: 69 % (ref 43–77)
PLATELETS: 259 10*3/uL (ref 150–400)
RBC: 4.44 MIL/uL (ref 3.87–5.11)
RDW: 17.4 % — ABNORMAL HIGH (ref 11.5–15.5)
WBC: 6.3 10*3/uL (ref 4.0–10.5)

## 2015-07-21 LAB — URINALYSIS, ROUTINE W REFLEX MICROSCOPIC
BILIRUBIN URINE: NEGATIVE
Glucose, UA: NEGATIVE mg/dL
Hgb urine dipstick: NEGATIVE
Ketones, ur: NEGATIVE mg/dL
Leukocytes, UA: NEGATIVE
Nitrite: NEGATIVE
PH: 6.5 (ref 5.0–8.0)
Protein, ur: NEGATIVE mg/dL
Specific Gravity, Urine: 1.025 (ref 1.005–1.030)
Urobilinogen, UA: 0.2 mg/dL (ref 0.0–1.0)

## 2015-07-21 LAB — WET PREP, GENITAL
CLUE CELLS WET PREP: NONE SEEN
Trich, Wet Prep: NONE SEEN
Yeast Wet Prep HPF POC: NONE SEEN

## 2015-07-21 LAB — HCG, QUANTITATIVE, PREGNANCY: hCG, Beta Chain, Quant, S: 1239 m[IU]/mL — ABNORMAL HIGH (ref <5)

## 2015-07-21 LAB — POC URINE PREG, ED: Preg Test, Ur: POSITIVE — AB

## 2015-07-21 LAB — LIPASE, BLOOD: Lipase: 15 U/L — ABNORMAL LOW (ref 22–51)

## 2015-07-21 MED ORDER — IOHEXOL 300 MG/ML  SOLN: 50.0000 mL | Freq: Once | INTRAMUSCULAR | Status: DC | PRN

## 2015-07-21 MED ORDER — SODIUM CHLORIDE 0.9 % IV SOLN
1000.0000 mL | Freq: Once | INTRAVENOUS | Status: AC
  Administered 2015-07-21: 1000 mL via INTRAVENOUS

## 2015-07-21 MED ORDER — MORPHINE SULFATE 4 MG/ML IJ SOLN
4.0000 mg | Freq: Once | INTRAMUSCULAR | Status: AC
  Administered 2015-07-21: 4 mg via INTRAVENOUS
  Filled 2015-07-21: qty 1

## 2015-07-21 MED ORDER — ONDANSETRON HCL 4 MG/2ML IJ SOLN
4.0000 mg | Freq: Once | INTRAMUSCULAR | Status: AC
  Administered 2015-07-21: 4 mg via INTRAVENOUS
  Filled 2015-07-21: qty 2

## 2015-07-21 NOTE — ED Notes (Signed)
Patient transported to Ultrasound 

## 2015-07-21 NOTE — ED Notes (Addendum)
Pt c/o RLQ pain radiating to right flank since 0700 today.  Pt also c/o nausea without vomiting.  Pt states pain worsened while she was attempting to have a bowel movement this morning.  Pt denies urinary complaints, vaginal discharge.  Pt has hernia but states it does not feel like pain from hernia.

## 2015-07-21 NOTE — ED Provider Notes (Signed)
CSN: [id number]     Arrival date & time 07/21/15  1035 History   First MD Initiated Contact with Patient 07/21/15 1037     Chief Complaint  Patient presents with  . Abdominal Pain    Abdominal pain x 1hr.      (Consider location/radiation/quality/duration/timing/severity/associated sxs/prior Treatment) Patient is a 39 y.o. female presenting with abdominal pain. The history is provided by the patient.  Abdominal Pain Pain location:  RLQ Pain quality: cramping, sharp, shooting and stabbing   Pain radiates to:  Does not radiate Pain severity:  Severe Onset quality:  Sudden Duration:  4 hours Timing:  Constant Progression:  Worsening Chronicity:  New Relieved by:  Nothing Worsened by:  Movement and palpation Ineffective treatments:  None tried Associated symptoms: no chest pain, no chills, no constipation (hard stool), no dysuria, no fever, no nausea, no shortness of breath and no vomiting   Risk factors: obesity    39 year old female with a chief complaint of right lower quadrant pain. Patient states pain started 4 hours ago. Sharp shooting cramps. Pain is worse with a bowel movement. Patient denies any pain with motion or pain while going over bumps car. Patient denies any rebound. Patient nauseated but without vomiting. Subjective fever and chills at home. Positive anorexia. Hard stool this morning which worsened her pain. Denies vaginal bleeding discharge.  Past Medical History  Diagnosis Date  . DVT (deep venous thrombosis) 2003    also had an infection in left ankle at same time; not pregnant (lovenox/coumadin)  . Anemia   . Obesity   . Medical history non-contributory   . Fibroid   . Hiatal hernia    Past Surgical History  Procedure Laterality Date  . No past surgeries    . Hiatal hernia repair    . Dilation and curettage of uterus     Family History  Problem Relation Age of Onset  . COPD Mother   . Hypertension Mother   . Drug abuse Father     died of drug  overdose   History  Substance Use Topics  . Smoking status: Current Every Day Smoker -- 1.00 packs/day    Types: Cigarettes  . Smokeless tobacco: Not on file  . Alcohol Use: 0.0 oz/week    0 Standard drinks or equivalent per week     Comment: socially   OB History    Gravida Para Term Preterm AB TAB SAB Ectopic Multiple Living   12 5 4 1 6     5      Review of Systems  Constitutional: Negative for fever and chills.  HENT: Negative for congestion and rhinorrhea.   Eyes: Negative for redness and visual disturbance.  Respiratory: Negative for shortness of breath and wheezing.   Cardiovascular: Negative for chest pain and palpitations.  Gastrointestinal: Positive for abdominal pain. Negative for nausea, vomiting and constipation (hard stool).  Genitourinary: Negative for dysuria and urgency.  Musculoskeletal: Negative for myalgias and arthralgias.  Skin: Negative for pallor and wound.  Neurological: Negative for dizziness and headaches.      Allergies  Review of patient's allergies indicates no known allergies.  Home Medications   Prior to Admission medications   Medication Sig Start Date End Date Taking? Authorizing Provider  aspirin 325 MG tablet Take 325 mg by mouth every 6 (six) hours as needed for mild pain, moderate pain or headache.    Historical Provider, MD  Elastic Bandages & Supports (WRIST SPLINT/COCK-UP/LEFT M) MISC Left cockup wrist splint. Wear  nightly x 6 weeks. Patient may choose size. Patient not taking: Reported on 05/22/2015 04/16/15   Leeanne Rio, MD  pantoprazole (PROTONIX) 40 MG tablet Take 1 tablet (40 mg total) by mouth daily. Patient not taking: Reported on 05/22/2015 07/29/14   Timmothy Euler, MD  traMADol (ULTRAM) 50 MG tablet Take 1 tablet (50 mg total) by mouth every 6 (six) hours as needed. Patient not taking: Reported on 07/21/2015 05/22/15   Alvina Chou, PA-C   BP 107/69 mmHg  Pulse 60  Temp(Src) 98 F (36.7 C) (Oral)  Resp 16   Ht 5\' 8"  (1.727 m)  Wt 260 lb (117.935 kg)  BMI 39.54 kg/m2  SpO2 99%  LMP 07/02/2015 Physical Exam  Constitutional: She is oriented to person, place, and time. She appears well-developed and well-nourished. No distress.  HENT:  Head: Normocephalic and atraumatic.  Eyes: EOM are normal. Pupils are equal, round, and reactive to light.  Neck: Normal range of motion. Neck supple.  Cardiovascular: Normal rate and regular rhythm.  Exam reveals no gallop and no friction rub.   No murmur heard. Pulmonary/Chest: Effort normal. She has no wheezes. She has no rales.  Abdominal: Soft. She exhibits no distension. There is tenderness. There is no rebound and no guarding.    Genitourinary: Uterus is tender (diffuse). Cervix exhibits no motion tenderness. Right adnexum displays no mass, no tenderness and no fullness. Left adnexum displays no mass, no tenderness and no fullness.  Musculoskeletal: She exhibits no edema or tenderness.  Neurological: She is alert and oriented to person, place, and time.  Skin: Skin is warm and dry. She is not diaphoretic.  Psychiatric: She has a normal mood and affect. Her behavior is normal.    ED Course  Procedures (including critical care time) Labs Review Labs Reviewed  WET PREP, GENITAL - Abnormal; Notable for the following:    WBC, Wet Prep HPF POC FEW (*)    All other components within normal limits  CBC WITH DIFFERENTIAL/PLATELET - Abnormal; Notable for the following:    Hemoglobin 10.8 (*)    MCH 24.3 (*)    MCHC 29.5 (*)    RDW 17.4 (*)    All other components within normal limits  COMPREHENSIVE METABOLIC PANEL - Abnormal; Notable for the following:    Glucose, Bld 104 (*)    Calcium 8.8 (*)    Albumin 3.3 (*)    AST 14 (*)    ALT 10 (*)    All other components within normal limits  LIPASE, BLOOD - Abnormal; Notable for the following:    Lipase 15 (*)    All other components within normal limits  URINALYSIS, ROUTINE W REFLEX MICROSCOPIC (NOT AT  [facility]) - Abnormal; Notable for the following:    APPearance CLOUDY (*)    All other components within normal limits  HCG, QUANTITATIVE, PREGNANCY - Abnormal; Notable for the following:    hCG, Beta Chain, Quant, S 1239 (*)    All other components within normal limits  POC URINE PREG, ED - Abnormal; Notable for the following:    Preg Test, Ur POSITIVE (*)    All other components within normal limits  GC/CHLAMYDIA PROBE AMP ([place]) NOT AT [facility]    Imaging Review US Ob Comp Less 14 Wks  07/21/2015   ADDENDUM REPORT: 07/21/2015 13:50  ADDENDUM: These results were called by telephone at the time of interpretation on 07/21/2015 at 1:49 pm to Dr. Deno Etienne , who verbally acknowledged these results.  Electronically Signed   By: Lowella Grip III M.D.   On: 07/21/2015 13:50   07/21/2015   CLINICAL DATA:  Pelvic pain with positive pregnancy tests  EXAM: OBSTETRIC <14 WK [place] AND TRANSVAGINAL OB US  TECHNIQUE: Both transabdominal and transvaginal ultrasound examinations were performed for complete evaluation of the gestation as well as the maternal uterus, adnexal regions, and pelvic cul-de-sac. Transvaginal technique was performed to assess early pregnancy.  COMPARISON:  None.  FINDINGS: Intrauterine gestational sac: Not visualized  Yolk sac:  Not visualized  Embryo:  Not visualized  Cardiac Activity: Not visualized  Maternal uterus/adnexae: Uterus measures 8.3 x 6.5 x 7.5 cm. There is no intrauterine mass There is no demonstrable subchorionic hemorrhage. The cervical os is closed. Endometrium measures 5 mm in thickness.  The right ovary measures 2.6 x 1.4 x 1.5 cm. There is no right-sided pelvic mass. Left ovary measures 2.6 x 2.1 x 2.3 cm. Immediately adjacent to the left ovary, there is a complex masslike area measuring 4.3 x 3.3 x 3.4 cm which appears solid with mixed echogenicity. There is a small central cystic area within this more complex mass. A fetal pole is not seen outside of the uterus  currently. There is a small amount of free pelvic fluid.  IMPRESSION: No intrauterine gestation is seen. There is a somewhat complex left adnexal mass with a small amount of free pelvic fluid. This finding, while not definitive, does raise concern for potential ectopic gestation. Differential considerations for the lack of visualization of a gestational sac within the uterus includes gestation too early to be seen by either transabdominal technique; recent spontaneous abortion ; ectopic gestation. Close clinical and serial beta HCG evaluations are warranted, particularly given the findings in the left adnexal region. Timing of repeat ultrasound in part will depend on beta HCG values. Given the findings sonographically in the left adnexa, gynecologic consultation at this time may well be warranted.  Electronically Signed: By: Lowella Grip III M.D. On: 07/21/2015 13:45   US Ob Transvaginal  07/21/2015   ADDENDUM REPORT: 07/21/2015 13:50  ADDENDUM: These results were called by telephone at the time of interpretation on 07/21/2015 at 1:49 pm to Dr. Deno Etienne , who verbally acknowledged these results.   Electronically Signed   By: Lowella Grip III M.D.   On: 07/21/2015 13:50   07/21/2015   CLINICAL DATA:  Pelvic pain with positive pregnancy tests  EXAM: OBSTETRIC <14 WK [place] AND TRANSVAGINAL OB US  TECHNIQUE: Both transabdominal and transvaginal ultrasound examinations were performed for complete evaluation of the gestation as well as the maternal uterus, adnexal regions, and pelvic cul-de-sac. Transvaginal technique was performed to assess early pregnancy.  COMPARISON:  None.  FINDINGS: Intrauterine gestational sac: Not visualized  Yolk sac:  Not visualized  Embryo:  Not visualized  Cardiac Activity: Not visualized  Maternal uterus/adnexae: Uterus measures 8.3 x 6.5 x 7.5 cm. There is no intrauterine mass There is no demonstrable subchorionic hemorrhage. The cervical os is closed. Endometrium measures 5 mm in  thickness.  The right ovary measures 2.6 x 1.4 x 1.5 cm. There is no right-sided pelvic mass. Left ovary measures 2.6 x 2.1 x 2.3 cm. Immediately adjacent to the left ovary, there is a complex masslike area measuring 4.3 x 3.3 x 3.4 cm which appears solid with mixed echogenicity. There is a small central cystic area within this more complex mass. A fetal pole is not seen outside of the uterus currently. There is a small amount  of free pelvic fluid.  IMPRESSION: No intrauterine gestation is seen. There is a somewhat complex left adnexal mass with a small amount of free pelvic fluid. This finding, while not definitive, does raise concern for potential ectopic gestation. Differential considerations for the lack of visualization of a gestational sac within the uterus includes gestation too early to be seen by either transabdominal technique; recent spontaneous abortion ; ectopic gestation. Close clinical and serial beta HCG evaluations are warranted, particularly given the findings in the left adnexal region. Timing of repeat ultrasound in part will depend on beta HCG values. Given the findings sonographically in the left adnexa, gynecologic consultation at this time may well be warranted.  Electronically Signed: By: Lowella Grip III M.D. On: 07/21/2015 13:45     EKG Interpretation None      MDM   Final diagnoses:  RLQ abdominal pain  Pregnancy  First trimester pregnancy    39 year old female with the chief complaint of right lower quadrant tenderness. Right lower quadrant tenderness on exam.  Will obtain pelvic exam UA. CBC CMP.  POC preg test positive. Bedside ultrasound without IUP. Quantitative 1200. Official ultrasound performed concern for left adnexal mass. Cannot rule out ectopic pregnancy. OB consult. Patient reassessed pain improved significantly. Lab work without significant abnormality.  Spoke with Dr. Elly Modena,  case discussed in detail. With no obvious ectopic on ultrasound and  patient currently pain-free recommended follow-up in 48 hours at [facility].  3:25 PM:  I have discussed the diagnosis/risks/treatment options with the patient and family and believe the pt to be eligible for discharge home to follow-up with [facility] in 48 hours. We also discussed returning to the ED immediately if new or worsening sx occur. We discussed the sx which are most concerning (e.g., vaginal bleeding, syncope, sudden worsening pain) that necessitate immediate return. Medications administered to the patient during their visit and any new prescriptions provided to the patient are listed below.  Medications given during this visit Medications  iohexol (OMNIPAQUE) 300 MG/ML solution 50 mL (not administered)  0.9 %  sodium chloride infusion (0 mLs Intravenous Stopped 07/21/15 1300)  morphine 4 MG/ML injection 4 mg (4 mg Intravenous Given 07/21/15 1157)  ondansetron (ZOFRAN) injection 4 mg (4 mg Intravenous Given 07/21/15 1158)    New Prescriptions   No medications on file     Deno Etienne, DO 07/21/15 1526

## 2015-07-21 NOTE — Discharge Instructions (Signed)
Return for bleeding, passing out, sudden worsening pain.  First Trimester of Pregnancy The first trimester of pregnancy is from week 1 until the end of week 12 (months 1 through 3). A week after a sperm fertilizes an egg, the egg will implant on the wall of the uterus. This embryo will begin to develop into a baby. Genes from you and your partner are forming the baby. The female genes determine whether the baby is a boy or a girl. At 6-8 weeks, the eyes and face are formed, and the heartbeat can be seen on ultrasound. At the end of 12 weeks, all the baby's organs are formed.  Now that you are pregnant, you will want to do everything you can to have a healthy baby. Two of the most important things are to get good prenatal care and to follow your health care provider's instructions. Prenatal care is all the medical care you receive before the baby's birth. This care will help prevent, find, and treat any problems during the pregnancy and childbirth. BODY CHANGES Your body goes through many changes during pregnancy. The changes vary from woman to woman.   You may gain or lose a couple of pounds at first.  You may feel sick to your stomach (nauseous) and throw up (vomit). If the vomiting is uncontrollable, call your health care provider.  You may tire easily.  You may develop headaches that can be relieved by medicines approved by your health care provider.  You may urinate more often. Painful urination may mean you have a bladder infection.  You may develop heartburn as a result of your pregnancy.  You may develop constipation because certain hormones are causing the muscles that push waste through your intestines to slow down.  You may develop hemorrhoids or swollen, bulging veins (varicose veins).  Your breasts may begin to grow larger and become tender. Your nipples may stick out more, and the tissue that surrounds them (areola) may become darker.  Your gums may bleed and may be sensitive to  brushing and flossing.  Dark spots or blotches (chloasma, mask of pregnancy) may develop on your face. This will likely fade after the baby is born.  Your menstrual periods will stop.  You may have a loss of appetite.  You may develop cravings for certain kinds of food.  You may have changes in your emotions from day to day, such as being excited to be pregnant or being concerned that something may go wrong with the pregnancy and baby.  You may have more vivid and strange dreams.  You may have changes in your hair. These can include thickening of your hair, rapid growth, and changes in texture. Some women also have hair loss during or after pregnancy, or hair that feels dry or thin. Your hair will most likely return to normal after your baby is born. WHAT TO EXPECT AT YOUR PRENATAL VISITS During a routine prenatal visit:  You will be weighed to make sure you and the baby are growing normally.  Your blood pressure will be taken.  Your abdomen will be measured to track your baby's growth.  The fetal heartbeat will be listened to starting around week 10 or 12 of your pregnancy.  Test results from any previous visits will be discussed. Your health care provider may ask you:  How you are feeling.  If you are feeling the baby move.  If you have had any abnormal symptoms, such as leaking fluid, bleeding, severe headaches, or abdominal  cramping.  If you have any questions. Other tests that may be performed during your first trimester include:  Blood tests to find your blood type and to check for the presence of any previous infections. They will also be used to check for low iron levels (anemia) and Rh antibodies. Later in the pregnancy, blood tests for diabetes will be done along with other tests if problems develop.  Urine tests to check for infections, diabetes, or protein in the urine.  An ultrasound to confirm the proper growth and development of the baby.  An amniocentesis  to check for possible genetic problems.  Fetal screens for spina bifida and Down syndrome.  You may need other tests to make sure you and the baby are doing well. HOME CARE INSTRUCTIONS  Medicines  Follow your health care provider's instructions regarding medicine use. Specific medicines may be either safe or unsafe to take during pregnancy.  Take your prenatal vitamins as directed.  If you develop constipation, try taking a stool softener if your health care provider approves. Diet  Eat regular, well-balanced meals. Choose a variety of foods, such as meat or vegetable-based protein, fish, milk and low-fat dairy products, vegetables, fruits, and whole grain breads and cereals. Your health care provider will help you determine the amount of weight gain that is right for you.  Avoid raw meat and uncooked cheese. These carry germs that can cause birth defects in the baby.  Eating four or five small meals rather than three large meals a day may help relieve nausea and vomiting. If you start to feel nauseous, eating a few soda crackers can be helpful. Drinking liquids between meals instead of during meals also seems to help nausea and vomiting.  If you develop constipation, eat more high-fiber foods, such as fresh vegetables or fruit and whole grains. Drink enough fluids to keep your urine clear or pale yellow. Activity and Exercise  Exercise only as directed by your health care provider. Exercising will help you:  Control your weight.  Stay in shape.  Be prepared for labor and delivery.  Experiencing pain or cramping in the lower abdomen or low back is a good sign that you should stop exercising. Check with your health care provider before continuing normal exercises.  Try to avoid standing for long periods of time. Move your legs often if you must stand in one place for a long time.  Avoid heavy lifting.  Wear low-heeled shoes, and practice good posture.  You may continue to have  sex unless your health care provider directs you otherwise. Relief of Pain or Discomfort  Wear a good support bra for breast tenderness.   Take warm sitz baths to soothe any pain or discomfort caused by hemorrhoids. Use hemorrhoid cream if your health care provider approves.   Rest with your legs elevated if you have leg cramps or low back pain.  If you develop varicose veins in your legs, wear support hose. Elevate your feet for 15 minutes, 3-4 times a day. Limit salt in your diet. Prenatal Care  Schedule your prenatal visits by the twelfth week of pregnancy. They are usually scheduled monthly at first, then more often in the last 2 months before delivery.  Write down your questions. Take them to your prenatal visits.  Keep all your prenatal visits as directed by your health care provider. Safety  Wear your seat belt at all times when driving.  Make a list of emergency phone numbers, including numbers for family, friends,  the hospital, and police and fire departments. General Tips  Ask your health care provider for a referral to a local prenatal education class. Begin classes no later than at the beginning of month 6 of your pregnancy.  Ask for help if you have counseling or nutritional needs during pregnancy. Your health care provider can offer advice or refer you to specialists for help with various needs.  Do not use hot tubs, steam rooms, or saunas.  Do not douche or use tampons or scented sanitary pads.  Do not cross your legs for long periods of time.  Avoid cat litter boxes and soil used by cats. These carry germs that can cause birth defects in the baby and possibly loss of the fetus by miscarriage or stillbirth.  Avoid all smoking, herbs, alcohol, and medicines not prescribed by your health care provider. Chemicals in these affect the formation and growth of the baby.  Schedule a dentist appointment. At home, brush your teeth with a soft toothbrush and be gentle when  you floss. SEEK MEDICAL CARE IF:   You have dizziness.  You have mild pelvic cramps, pelvic pressure, or nagging pain in the abdominal area.  You have persistent nausea, vomiting, or diarrhea.  You have a bad smelling vaginal discharge.  You have pain with urination.  You notice increased swelling in your face, hands, legs, or ankles. SEEK IMMEDIATE MEDICAL CARE IF:   You have a fever.  You are leaking fluid from your vagina.  You have spotting or bleeding from your vagina.  You have severe abdominal cramping or pain.  You have rapid weight gain or loss.  You vomit blood or material that looks like coffee grounds.  You are exposed to [place] measles and have never had them.  You are exposed to fifth disease or chickenpox.  You develop a severe headache.  You have shortness of breath.  You have any kind of trauma, such as from a fall or a car accident. Document Released: 12/06/2001 Document Revised: 04/28/2014 Document Reviewed: 10/22/2013 [facility] Patient Information 2015 [place], [place]. This information is not intended to replace advice given to you by your health care provider. Make sure you discuss any questions you have with your health care provider.

## 2015-07-21 NOTE — ED Notes (Signed)
MD at bedside. 

## 2015-07-21 NOTE — ED Notes (Signed)
Bed: WA17 Expected date:  Expected time:  Means of arrival:  Comments: EMS- 39yo M, abdominal pain, Hx of hernia

## 2015-07-22 LAB — GC/CHLAMYDIA PROBE AMP (~~LOC~~) NOT AT ARMC
Chlamydia: NEGATIVE
Neisseria Gonorrhea: NEGATIVE

## 2015-07-24 ENCOUNTER — Encounter (HOSPITAL_COMMUNITY): Payer: Self-pay | Admitting: Radiology

## 2015-07-24 ENCOUNTER — Encounter (HOSPITAL_COMMUNITY)
Admission: AD | Disposition: A | Discharge status: Home / Self Care | Payer: Self-pay | Source: Ambulatory Visit | Attending: Obstetrics & Gynecology

## 2015-07-24 ENCOUNTER — Inpatient Hospital Stay (HOSPITAL_COMMUNITY): Payer: Medicaid Other | Admitting: Anesthesiology

## 2015-07-24 ENCOUNTER — Inpatient Hospital Stay (HOSPITAL_COMMUNITY): Payer: Medicaid Other

## 2015-07-24 ENCOUNTER — Ambulatory Visit (HOSPITAL_COMMUNITY)
Admission: AD | Admit: 2015-07-24 | Discharge: 2015-07-24 | Disposition: A | Discharge status: Home / Self Care | Payer: Medicaid Other | Source: Ambulatory Visit | Attending: Obstetrics & Gynecology | Admitting: Obstetrics & Gynecology

## 2015-07-24 DIAGNOSIS — O001 Tubal pregnancy: Secondary | ICD-10-CM

## 2015-07-24 DIAGNOSIS — O26899 Other specified pregnancy related conditions, unspecified trimester: Secondary | ICD-10-CM

## 2015-07-24 DIAGNOSIS — Z86718 Personal history of other venous thrombosis and embolism: Secondary | ICD-10-CM | POA: Diagnosis not present

## 2015-07-24 DIAGNOSIS — F17210 Nicotine dependence, cigarettes, uncomplicated: Secondary | ICD-10-CM | POA: Insufficient documentation

## 2015-07-24 DIAGNOSIS — R109 Unspecified abdominal pain: Secondary | ICD-10-CM

## 2015-07-24 DIAGNOSIS — O00109 Unspecified tubal pregnancy without intrauterine pregnancy: Secondary | ICD-10-CM | POA: Diagnosis present

## 2015-07-24 DIAGNOSIS — F419 Anxiety disorder, unspecified: Secondary | ICD-10-CM | POA: Insufficient documentation

## 2015-07-24 DIAGNOSIS — Z7982 Long term (current) use of aspirin: Secondary | ICD-10-CM | POA: Diagnosis not present

## 2015-07-24 HISTORY — PX: DIAGNOSTIC LAPAROSCOPY WITH REMOVAL OF ECTOPIC PREGNANCY: SHX6449

## 2015-07-24 LAB — CBC WITH DIFFERENTIAL/PLATELET
Basophils Absolute: 0 10*3/uL (ref 0.0–0.1)
Basophils Relative: 0 % (ref 0–1)
EOS ABS: 0 10*3/uL (ref 0.0–0.7)
EOS PCT: 1 % (ref 0–5)
HEMATOCRIT: 35.6 % — AB (ref 36.0–46.0)
Hemoglobin: 10.8 g/dL — ABNORMAL LOW (ref 12.0–15.0)
Lymphocytes Relative: 53 % — ABNORMAL HIGH (ref 12–46)
Lymphs Abs: 1.9 10*3/uL (ref 0.7–4.0)
MCH: 25.2 pg — AB (ref 26.0–34.0)
MCHC: 30.3 g/dL (ref 30.0–36.0)
MCV: 83 fL (ref 78.0–100.0)
MONOS PCT: 14 % — AB (ref 3–12)
Monocytes Absolute: 0.5 10*3/uL (ref 0.1–1.0)
NEUTROS PCT: 32 % — AB (ref 43–77)
Neutro Abs: 1.2 10*3/uL — ABNORMAL LOW (ref 1.7–7.7)
Platelets: 261 10*3/uL (ref 150–400)
RBC: 4.29 MIL/uL (ref 3.87–5.11)
RDW: 17.6 % — ABNORMAL HIGH (ref 11.5–15.5)
WBC: 3.7 10*3/uL — AB (ref 4.0–10.5)

## 2015-07-24 LAB — HCG, QUANTITATIVE, PREGNANCY: hCG, Beta Chain, Quant, S: 229 m[IU]/mL — ABNORMAL HIGH (ref <5)

## 2015-07-24 SURGERY — LAPAROSCOPY, WITH ECTOPIC PREGNANCY SURGICAL TREATMENT
Anesthesia: General | Site: Abdomen

## 2015-07-24 MED ORDER — ROCURONIUM BROMIDE 100 MG/10ML IV SOLN
INTRAVENOUS | Status: AC
  Filled 2015-07-24: qty 1

## 2015-07-24 MED ORDER — FAMOTIDINE IN NACL 20-0.9 MG/50ML-% IV SOLN
20.0000 mg | Freq: Once | INTRAVENOUS | Status: AC
  Administered 2015-07-24: 20 mg via INTRAVENOUS

## 2015-07-24 MED ORDER — PROMETHAZINE HCL 25 MG PO TABS: 25.0000 mg | ORAL_TABLET | Freq: Four times a day (QID) | ORAL | Status: DC | PRN

## 2015-07-24 MED ORDER — PHENYLEPHRINE HCL 10 MG/ML IJ SOLN
INTRAMUSCULAR | Status: DC | PRN
  Administered 2015-07-24: 40 ug via INTRAVENOUS
  Administered 2015-07-24 (×2): 80 ug via INTRAVENOUS
  Administered 2015-07-24: 40 ug via INTRAVENOUS
  Administered 2015-07-24 (×2): 80 ug via INTRAVENOUS

## 2015-07-24 MED ORDER — FENTANYL CITRATE (PF) 100 MCG/2ML IJ SOLN
INTRAMUSCULAR | Status: DC
Start: 2015-07-24 — End: 2015-07-25
  Filled 2015-07-24: qty 2

## 2015-07-24 MED ORDER — KETOROLAC TROMETHAMINE 30 MG/ML IJ SOLN
INTRAMUSCULAR | Status: DC | PRN
  Administered 2015-07-24: 30 mg via INTRAVENOUS

## 2015-07-24 MED ORDER — NEOSTIGMINE METHYLSULFATE 10 MG/10ML IV SOLN
INTRAVENOUS | Status: DC | PRN
  Administered 2015-07-24: 3 mg via INTRAVENOUS

## 2015-07-24 MED ORDER — LACTATED RINGERS IV SOLN
Freq: Once | INTRAVENOUS | Status: AC
  Administered 2015-07-24: 20:00:00 via INTRAVENOUS

## 2015-07-24 MED ORDER — LIDOCAINE HCL (CARDIAC) 20 MG/ML IV SOLN
INTRAVENOUS | Status: AC
  Filled 2015-07-24: qty 5

## 2015-07-24 MED ORDER — GLYCOPYRROLATE 0.2 MG/ML IJ SOLN
INTRAMUSCULAR | Status: DC | PRN
  Administered 2015-07-24: 0.6 mg via INTRAVENOUS

## 2015-07-24 MED ORDER — MIDAZOLAM HCL 2 MG/2ML IJ SOLN
INTRAMUSCULAR | Status: DC | PRN
  Administered 2015-07-24: 2 mg via INTRAVENOUS

## 2015-07-24 MED ORDER — METOCLOPRAMIDE HCL 5 MG/ML IJ SOLN
INTRAMUSCULAR | Status: AC
  Filled 2015-07-24: qty 2

## 2015-07-24 MED ORDER — ONDANSETRON HCL 4 MG/2ML IJ SOLN
INTRAMUSCULAR | Status: DC | PRN
  Administered 2015-07-24: 4 mg via INTRAVENOUS

## 2015-07-24 MED ORDER — SUCCINYLCHOLINE CHLORIDE 20 MG/ML IJ SOLN
INTRAMUSCULAR | Status: AC
  Filled 2015-07-24: qty 1

## 2015-07-24 MED ORDER — ONDANSETRON HCL 4 MG/2ML IJ SOLN
INTRAMUSCULAR | Status: AC
  Filled 2015-07-24: qty 2

## 2015-07-24 MED ORDER — LIDOCAINE HCL (CARDIAC) 20 MG/ML IV SOLN
INTRAVENOUS | Status: DC | PRN
  Administered 2015-07-24: 100 mg via INTRAVENOUS

## 2015-07-24 MED ORDER — SUCCINYLCHOLINE CHLORIDE 20 MG/ML IJ SOLN
INTRAMUSCULAR | Status: DC | PRN
  Administered 2015-07-24: 160 mg via INTRAVENOUS

## 2015-07-24 MED ORDER — METOCLOPRAMIDE HCL 5 MG/ML IJ SOLN
10.0000 mg | Freq: Once | INTRAMUSCULAR | Status: AC
  Administered 2015-07-24: 10 mg via INTRAVENOUS

## 2015-07-24 MED ORDER — FENTANYL CITRATE (PF) 100 MCG/2ML IJ SOLN
INTRAMUSCULAR | Status: DC | PRN
  Administered 2015-07-24: 50 ug via INTRAVENOUS
  Administered 2015-07-24: 100 ug via INTRAVENOUS

## 2015-07-24 MED ORDER — EPHEDRINE 5 MG/ML INJ
INTRAVENOUS | Status: AC
  Filled 2015-07-24: qty 10

## 2015-07-24 MED ORDER — BUPIVACAINE HCL (PF) 0.5 % IJ SOLN
INTRAMUSCULAR | Status: AC
  Filled 2015-07-24: qty 30

## 2015-07-24 MED ORDER — PROMETHAZINE HCL 25 MG/ML IJ SOLN: 6.2500 mg | INTRAMUSCULAR | Status: DC | PRN

## 2015-07-24 MED ORDER — PROPOFOL 10 MG/ML IV BOLUS
INTRAVENOUS | Status: AC
Start: 2015-07-24 — End: 2015-07-24
  Filled 2015-07-24: qty 40

## 2015-07-24 MED ORDER — FENTANYL CITRATE (PF) 250 MCG/5ML IJ SOLN
INTRAMUSCULAR | Status: AC
  Filled 2015-07-24: qty 5

## 2015-07-24 MED ORDER — PROPOFOL 10 MG/ML IV BOLUS
INTRAVENOUS | Status: DC | PRN
  Administered 2015-07-24: 200 mg via INTRAVENOUS

## 2015-07-24 MED ORDER — FAMOTIDINE IN NACL 20-0.9 MG/50ML-% IV SOLN
INTRAVENOUS | Status: AC
  Filled 2015-07-24: qty 50

## 2015-07-24 MED ORDER — LACTATED RINGERS IR SOLN
Status: DC | PRN
  Administered 2015-07-24: 3000 mL

## 2015-07-24 MED ORDER — DEXAMETHASONE SODIUM PHOSPHATE 10 MG/ML IJ SOLN
INTRAMUSCULAR | Status: DC | PRN
  Administered 2015-07-24: 10 mg via INTRAVENOUS

## 2015-07-24 MED ORDER — FENTANYL CITRATE (PF) 100 MCG/2ML IJ SOLN
25.0000 ug | INTRAMUSCULAR | Status: DC | PRN
  Administered 2015-07-24: 25 ug via INTRAVENOUS

## 2015-07-24 MED ORDER — MEPERIDINE HCL 25 MG/ML IJ SOLN: 6.2500 mg | INTRAMUSCULAR | Status: DC | PRN

## 2015-07-24 MED ORDER — ROCURONIUM BROMIDE 100 MG/10ML IV SOLN
INTRAVENOUS | Status: DC | PRN
  Administered 2015-07-24: 40 mg via INTRAVENOUS

## 2015-07-24 MED ORDER — BUPIVACAINE HCL (PF) 0.25 % IJ SOLN
INTRAMUSCULAR | Status: DC | PRN
  Administered 2015-07-24: 8 mL

## 2015-07-24 MED ORDER — OXYCODONE-ACETAMINOPHEN 5-325 MG PO TABS: 1.0000 | ORAL_TABLET | Freq: Four times a day (QID) | ORAL | Status: DC | PRN

## 2015-07-24 MED ORDER — DEXAMETHASONE SODIUM PHOSPHATE 10 MG/ML IJ SOLN
INTRAMUSCULAR | Status: AC
  Filled 2015-07-24: qty 1

## 2015-07-24 MED ORDER — LACTATED RINGERS IV SOLN
INTRAVENOUS | Status: DC | PRN
  Administered 2015-07-24 (×2): via INTRAVENOUS

## 2015-07-24 MED ORDER — MIDAZOLAM HCL 2 MG/2ML IJ SOLN
INTRAMUSCULAR | Status: AC
  Filled 2015-07-24: qty 2

## 2015-07-24 MED ORDER — PHENYLEPHRINE 40 MCG/ML (10ML) SYRINGE FOR IV PUSH (FOR BLOOD PRESSURE SUPPORT)
PREFILLED_SYRINGE | INTRAVENOUS | Status: AC
  Filled 2015-07-24: qty 20

## 2015-07-24 MED ORDER — KETOROLAC TROMETHAMINE 30 MG/ML IJ SOLN
INTRAMUSCULAR | Status: AC
  Filled 2015-07-24: qty 1

## 2015-07-24 SURGICAL SUPPLY — 30 items
BAG SPEC RTRVL LRG 6X4 10 (ENDOMECHANICALS) ×1
CLOTH BEACON ORANGE TIMEOUT ST (SAFETY) ×2 IMPLANT
DECANTER SPIKE VIAL GLASS SM (MISCELLANEOUS) ×2 IMPLANT
DRAPE WARM FLUID 44X44 (DRAPE) ×2 IMPLANT
DRSG COVADERM PLUS 2X2 (GAUZE/BANDAGES/DRESSINGS) ×2 IMPLANT
DRSG OPSITE POSTOP 3X4 (GAUZE/BANDAGES/DRESSINGS) ×2 IMPLANT
DURAPREP 26ML APPLICATOR (WOUND CARE) ×2 IMPLANT
GLOVE BIOGEL PI IND STRL 7.0 (GLOVE) IMPLANT
GLOVE BIOGEL PI INDICATOR 7.0 (GLOVE) ×6
GLOVE ECLIPSE 7.0 STRL STRAW (GLOVE) ×12 IMPLANT
GLOVE INDICATOR 7.0 STRL GRN (GLOVE) ×4 IMPLANT
GLOVE SURG SS PI 7.0 STRL IVOR (GLOVE) ×12 IMPLANT
GOWN STRL REUS W/TWL LRG LVL3 (GOWN DISPOSABLE) ×8 IMPLANT
LIQUID BAND (GAUZE/BANDAGES/DRESSINGS) ×2 IMPLANT
PACK LAPAROSCOPY BASIN (CUSTOM PROCEDURE TRAY) ×2 IMPLANT
PAD POSITIONER PINK NONSTERILE (MISCELLANEOUS) ×2 IMPLANT
POUCH SPECIMEN RETRIEVAL 10MM (ENDOMECHANICALS) ×2 IMPLANT
SET IRRIG TUBING LAPAROSCOPIC (IRRIGATION / IRRIGATOR) ×2 IMPLANT
SHEARS HARMONIC ACE PLUS 36CM (ENDOMECHANICALS) ×2 IMPLANT
SUT VIC AB 3-0 X1 27 (SUTURE) ×2 IMPLANT
SUT VICRYL 0 UR6 27IN ABS (SUTURE) ×4 IMPLANT
SUT VICRYL 4-0 PS2 18IN ABS (SUTURE) ×2 IMPLANT
TOWEL OR 17X24 6PK STRL BLUE (TOWEL DISPOSABLE) ×4 IMPLANT
TRAY FOLEY CATH SILVER 14FR (SET/KITS/TRAYS/PACK) ×2 IMPLANT
TROCAR 5M 150ML BLDLS (TROCAR) ×2 IMPLANT
TROCAR BALLN 12MMX100 BLUNT (TROCAR) ×2 IMPLANT
TROCAR OPTI TIP 5M 100M (ENDOMECHANICALS) ×6 IMPLANT
TROCAR XCEL NON-BLD 11X100MML (ENDOMECHANICALS) ×2 IMPLANT
TROCAR XCEL NON-BLD 5MMX100MML (ENDOMECHANICALS) ×4 IMPLANT
WARMER LAPAROSCOPE (MISCELLANEOUS) ×2 IMPLANT

## 2015-07-24 NOTE — Op Note (Signed)
Candace Ramirez  PROCEDURE DATE: 07/24/2015  PREOPERATIVE DIAGNOSIS: Ruptured ectopic pregnancy  POSTOPERATIVE DIAGNOSIS: Ruptured left fallopian tube ectopic pregnancy  PROCEDURE: Laparoscopic left salpingectomy   SURGEON:  Dr. Darron Doom  ASSISTANT: Loma Boston, MD  ANESTHESIOLOGIST: Lyn Hollingshead, MD  INDICATIONS: 39 y.o. [id number] at 5 wks here for with ruptured ectopic pregnancy with blood type O pos. Patient was counseled regarding need for laparoscopic salpingectomy. Risks of surgery including bleeding which may require transfusion or reoperation, infection, injury to bowel or other surrounding organs, need for additional procedures including laparotomy and other postoperative/anesthesia complications were explained to patient.  Written informed consent was obtained.  FINDINGS:  Small amount of hemoperitoneum estimated to be about 50cc of blood and clots.  Dilated left fallopian tube containing ectopic gestation. Small normal appearing uterus, normal right fallopian tube, right ovary and left ovary.  ANESTHESIA: General  SPECIMENS: left fallopian tube to pathology  COMPLICATIONS: None immediate  PROCEDURE IN DETAIL:  The patient was taken to the operating room where general anesthesia was administered and was found to be adequate.  She was placed in the dorsal lithotomy position, and was prepped and draped in a sterile manner.  A Foley catheter was inserted into her bladder and attached to constant drainage and a uterine manipulator was then advanced into the uterus .  After an adequate timeout was performed, attention was then turned to the patient'Ramirez abdomen where a 10-mm skin incision was made on the umbilical fold. Attempt made to enter fascia below previous hernia repair, but mesh and permanent suture encountered.  This was abandoned and RUQ placement done with 5 mm Excel trocar under direct visualization.  . The laparoscope was introduced.  A survey of the patient'Ramirez pelvis  and abdomen revealed the findings as above.  Bilateral lower quadrant ports were placed under direct visualization; 5-mm on the left and 5-mm on the right.  Attention was then turned to the left fallopian tube which was grasped and ligated from the underlying mesosalpinx and uterine attachment using the Harmonic scalpel.  Good hemostasis was noted.  The RUQ port was exchanged for the 11 Excel trocar. The specimen was placed in an EndoCatch bag and removed from the abdomen intact.  The abdomen was desufflated, and all instruments were removed.  The umbilicus incision was closed with a deep stitch as fascia not entered and 3-0 vicryl Subcuticular suture and all skin incisions were closed with a 3-0 Vicryl subcuticular stitch. The RUQ Excel port did not leave a defect large enough to need fascial closure. The Hulka tenaculum was removed.  The patient tolerated the procedures well.  All instruments, needles, and sponge counts were correct x 2. The patient was taken to the recovery room in stable condition. Candace  Casmer Yepiz S MD 07/24/2015 7:27 PM

## 2015-07-24 NOTE — H&P (Signed)
Candace Ramirez is an 39 y.o. [id number] Unknown female.   Chief Complaint: ectopic pregnancy HPI:  F/u BHCG which is falling, but large adnexal mass which measures 3.5 cm.  Increasing pain. Pain since 7/26 in lower abdomen. More on right, sharp pain that is constant.  Past Medical History  Diagnosis Date  . DVT (deep venous thrombosis) 2003    also had an infection in left ankle at same time; not pregnant (lovenox/coumadin)  . Anemia   . Obesity   . Fibroid   . Hiatal hernia     Past Surgical History  Procedure Laterality Date  . No past surgeries    . Hiatal hernia repair    . Dilation and curettage of uterus      Family History  Problem Relation Age of Onset  . COPD Mother   . Hypertension Mother   . Drug abuse Father     died of drug overdose   Social History:  reports that she has been smoking Cigarettes.  She has been smoking about 1.00 pack per day. She does not have any smokeless tobacco history on file. She reports that she drinks alcohol. She reports that she uses illicit drugs (Marijuana).  Allergies: No Known Allergies  No current facility-administered medications on file prior to encounter.   Current Outpatient Prescriptions on File Prior to Encounter  Medication Sig Dispense Refill  . aspirin 325 MG tablet Take 325 mg by mouth every 6 (six) hours as needed for mild pain, moderate pain or headache.    Water engineer Bandages & Supports (WRIST SPLINT/COCK-UP/LEFT M) MISC Left cockup wrist splint. Wear nightly x 6 weeks. Patient may choose size. (Patient not taking: Reported on 05/22/2015) 1 each 0  . pantoprazole (PROTONIX) 40 MG tablet Take 1 tablet (40 mg total) by mouth daily. (Patient not taking: Reported on 05/22/2015) 30 tablet 3  . traMADol (ULTRAM) 50 MG tablet Take 1 tablet (50 mg total) by mouth every 6 (six) hours as needed. (Patient not taking: Reported on 07/21/2015) 15 tablet 0    A comprehensive review of systems was negative. except as noted in  HPI  Blood pressure 153/93, pulse 88, resp. rate 18, last menstrual period 07/02/2015. BP 153/93 mmHg  Pulse 88  Resp 18  LMP 07/02/2015 General appearance: alert, cooperative, appears stated age and moderately obese Head: Normocephalic, without obvious abnormality, atraumatic Neck: supple, symmetrical, trachea midline Lungs: normal effort Heart: regular rate and rhythm Abdomen: sorft, large hernia supraumbilically, no scars, tender to palpation in the lower abdomen. Extremities: extremities normal, atraumatic, no cyanosis or edema Skin: Skin color, texture, turgor normal. No rashes or lesions Neurologic: Grossly normal   Lab Results  Component Value Date   WBC 3.7* 07/24/2015   HGB 10.8* 07/24/2015   HCT 35.6* 07/24/2015   MCV 83.0 07/24/2015   PLT 261 07/24/2015   Lab Results  Component Value Date   PREGTESTUR POSITIVE* 07/21/2015   U/s reviewed: IMPRESSION: No intrauterine gestation is seen. There is a somewhat complex left adnexal mass with a small amount of free pelvic fluid. This finding, while not definitive, does raise concern for potential ectopic gestation. Differential considerations for the lack of visualization of a gestational sac within the uterus includes gestation too early to be seen by either transabdominal technique; recent spontaneous abortion ; ectopic gestation. Close clinical and serial beta HCG evaluations are warranted, particularly given the findings in the left adnexal region. Timing of repeat ultrasound in part will depend on beta HCG  values. Given the findings sonographically in the left adnexa, gynecologic consultation at this time may well be warranted.  Assessment/Plan Principal Problem:   Ectopic pregnancy, tubal  For laparoscopic removal. Risks include but are not limited to bleeding, infection, injury to surrounding structures, including bowel, bladder and ureters, blood clots, and death.  Likelihood of success is  high.   Vickey Ewbank S 07/24/2015, 5:46 PM

## 2015-07-24 NOTE — Discharge Instructions (Signed)
Diagnostic Laparoscopy Laparoscopy is a surgical procedure. It is used to diagnose and treat diseases inside the belly (abdomen). It is usually a brief, common, and relatively simple procedure. The laparoscopeis a thin, lighted, pencil-sized instrument. It is like a telescope. It is inserted into your abdomen through a small cut (incision). Your caregiver can look at the organs inside your body through this instrument. He or she can see if there is anything abnormal. Laparoscopy can be done either in a hospital or outpatient clinic. You may be given a mild sedative to help you relax before the procedure. Once in the operating room, you will be given a drug to make you sleep (general anesthesia). Laparoscopy usually lasts less than 1 hour. After the procedure, you will be monitored in a recovery area until you are stable and doing well. Once you are home, it will take 2 to 3 days to fully recover. RISKS AND COMPLICATIONS  Laparoscopy has relatively few risks. Your caregiver will discuss the risks with you before the procedure. Some problems that can occur include:  Infection.  Bleeding.  Damage to other organs.  Anesthetic side effects. PROCEDURE Once you receive anesthesia, your surgeon inflates the abdomen with a harmless gas (carbon dioxide). This makes the organs easier to see. The laparoscope is inserted into the abdomen through a small incision. This allows your surgeon to see into the abdomen. Other small instruments are also inserted into the abdomen through other small openings. Many surgeons attach a video camera to the laparoscope to enlarge the view. During a diagnostic laparoscopy, the surgeon may be looking for inflammation, infection, or cancer. Your surgeon may take tissue samples(biopsies). The samples are sent to a specialist in looking at cells and tissue samples (pathologist). The pathologist examines them under a microscope. Biopsies can help to diagnose or confirm a  disease. AFTER THE PROCEDURE   The gas is released from inside the abdomen.  The incisions are closed with stitches (sutures). Because these incisions are small (usually less than 1/2 inch), there is usually minimal discomfort after the procedure. There may be some mild discomfort in the throat. This is from the tube placed in the throat while you were sleeping. You may have some mild abdominal discomfort. There may also be discomfort from the instrument placement incisions in the abdomen.  The recovery time is shortened as long as there are no complications.  You will rest in a recovery room until stable and doing well. As long as there are no complications, you may be allowed to go home. FINDING OUT THE RESULTS OF YOUR TEST Not all test results are available during your visit. If your test results are not back during the visit, make an appointment with your caregiver to find out the results. Do not assume everything is normal if you have not heard from your caregiver or the medical facility. It is important for you to follow up on all of your test results. HOME CARE INSTRUCTIONS   Take all medicines as directed.  Only take over-the-counter or prescription medicines for pain, discomfort, or fever as directed by your caregiver.  Resume daily activities as directed.  Showers are preferred over baths.  You may resume sexual activities in 1 week or as directed.  Do not drive while taking narcotics. SEEK MEDICAL CARE IF:   There is increasing abdominal pain.  There is new pain in the shoulders (shoulder strap areas).  You feel lightheaded or faint.  You have the chills.  You or  your child has an oral temperature above 102 F (38.9 C).  There is pus-like (purulent) drainage from any of the wounds.  You are unable to pass gas or have a bowel movement.  You feel sick to your stomach (nauseous) or throw up (vomit). MAKE SURE YOU:   Understand these instructions.  Will watch  your condition.  Will get help right away if you are not doing well or get worse. Document Released: 03/20/2001 Document Revised: 04/08/2013 Document Reviewed: 12/12/2007 [facility] Patient Information 2015 [place], [place]. This information is not intended to replace advice given to you by your health care provider. Make sure you discuss any questions you have with your health care provider. Ectopic Pregnancy An ectopic pregnancy happens when a fertilized egg grows outside the uterus. A pregnancy cannot live outside of the uterus. This problem often happens in the fallopian tube. It is often caused by damage to the fallopian tube. If this problem is found early, you may be treated with medicine. If your tube tears or bursts open (ruptures), you will bleed inside. This is an emergency. You will need surgery. Get help right away.  SYMPTOMS You may have normal pregnancy symptoms at first. These include:  Missing your period.  Feeling sick to your stomach (nauseous).  Being tired.  Having tender breasts. Then, you may start to have symptoms that are not normal. These include:  Pain with sex (intercourse).  Bleeding from the vagina. This includes light bleeding (spotting).  Belly (abdomen) or lower belly cramping or pain. This may be felt on one side.  A fast heartbeat (pulse).  Passing out (fainting) after going poop (bowel movement). If your tube tears, you may have symptoms such as:  Really bad pain in the belly or lower belly. This happens suddenly.  Dizziness.  Passing out.  Shoulder pain. GET HELP RIGHT AWAY IF:  You have any of these symptoms. This is an emergency. MAKE SURE YOU:  Understand these instructions.  Will watch your condition.  Will get help right away if you are not doing well or get worse. Document Released: 03/10/2009 Document Revised: 12/17/2013 Document Reviewed: 07/24/2013 [facility] Patient Information 2015 [place], [place]. This information is not  intended to replace advice given to you by your health care provider. Make sure you discuss any questions you have with your health care provider.  Post Anesthesia Home Care Instructions  Activity: Get plenty of rest for the remainder of the day. A responsible adult should stay with you for 24 hours following the procedure.  For the next 24 hours, DO NOT: -Drive a car -Paediatric nurse -Drink alcoholic beverages -Take any medication unless instructed by your physician -Make any legal decisions or sign important papers.  Meals: Start with liquid foods such as gelatin or soup. Progress to regular foods as tolerated. Avoid greasy, spicy, heavy foods. If nausea and/or vomiting occur, drink only clear liquids until the nausea and/or vomiting subsides. Call your physician if vomiting continues.  Special Instructions/Symptoms: Your throat may feel dry or sore from the anesthesia or the breathing tube placed in your throat during surgery. If this causes discomfort, gargle with warm salt water. The discomfort should disappear within 24 hours.  If you had a scopolamine patch placed behind your ear for the management of post- operative nausea and/or vomiting:  1. The medication in the patch is effective for 72 hours, after which it should be removed.  Wrap patch in a tissue and discard in the trash. Wash hands thoroughly with  soap and water. 2. You may remove the patch earlier than 72 hours if you experience unpleasant side effects which may include dry mouth, dizziness or visual disturbances. 3. Avoid touching the patch. Wash your hands with soap and water after contact with the patch.

## 2015-07-24 NOTE — Anesthesia Procedure Notes (Signed)
Procedure Name: Intubation Date/Time: 07/24/2015 6:18 PM Performed by: Jonna Munro Pre-anesthesia Checklist: Patient identified, Emergency Drugs available, Suction available, Patient being monitored and Timeout performed Patient Re-evaluated:Patient Re-evaluated prior to inductionOxygen Delivery Method: Circle system utilized Preoxygenation: Pre-oxygenation with 100% oxygen Intubation Type: Rapid sequence, Cricoid Pressure applied and IV induction Ventilation: Mask ventilation without difficulty Grade View: Grade I Tube type: Oral Tube size: 7.0 mm Number of attempts: 1 Airway Equipment and Method: Stylet Secured at: 21 cm Tube secured with: Tape Dental Injury: Teeth and Oropharynx as per pre-operative assessment

## 2015-07-24 NOTE — MAU Note (Addendum)
Denies any bleeding but is having some cramping; cramping since Tuesday;

## 2015-07-24 NOTE — Transfer of Care (Signed)
Immediate Anesthesia Transfer of Care Note  Patient: Candace Ramirez  Procedure(s) Performed: Procedure(s): DIAGNOSTIC LAPAROSCOPY WITH REMOVAL OF ECTOPIC PREGNANCY (N/A)  Patient Location: PACU  Anesthesia Type:General  Level of Consciousness: awake  Airway & Oxygen Therapy: Patient Spontanous Breathing  Post-op Assessment: Report given to PACU RN  Post vital signs: stable  Filed Vitals:   07/24/15 1758  BP:   Pulse:   Temp: 36.7 C  Resp:     Complications: No apparent anesthesia complications

## 2015-07-24 NOTE — MAU Provider Note (Signed)
History     CSN: [id number]  Arrival date and time: 07/24/15 1414   First Provider Initiated Contact with Patient 07/24/15 1540      Chief Complaint  Patient presents with  . Follow-up   HPI Candace Ramirez is a 39 y.o. [id number] at Unknown who presents to MAU today for 48 hour follow-up. The patient was seen [facility] on 07/21/15 with lower abdominal pain. She denies vaginal bleeding or fever. She states pain is mild to moderate and has continued since her previous visit.   OB History    Gravida Para Term Preterm AB TAB SAB Ectopic Multiple Living   13 5 4 1 6     5       Past Medical History  Diagnosis Date  . DVT (deep venous thrombosis) 2003    also had an infection in left ankle at same time; not pregnant (lovenox/coumadin)  . Anemia   . Obesity   . Medical history non-contributory   . Fibroid   . Hiatal hernia     Past Surgical History  Procedure Laterality Date  . No past surgeries    . Hiatal hernia repair    . Dilation and curettage of uterus      Family History  Problem Relation Age of Onset  . COPD Mother   . Hypertension Mother   . Drug abuse Father     died of drug overdose    History  Substance Use Topics  . Smoking status: Current Every Day Smoker -- 1.00 packs/day    Types: Cigarettes  . Smokeless tobacco: Not on file  . Alcohol Use: 0.0 oz/week    0 Standard drinks or equivalent per week     Comment: socially    Allergies: No Known Allergies  Prescriptions prior to admission  Medication Sig Dispense Refill Last Dose  . aspirin 325 MG tablet Take 325 mg by mouth every 6 (six) hours as needed for mild pain, moderate pain or headache.   Not Taking at Unknown time  . Elastic Bandages & Supports (WRIST SPLINT/COCK-UP/LEFT M) MISC Left cockup wrist splint. Wear nightly x 6 weeks. Patient may choose size. (Patient not taking: Reported on 05/22/2015) 1 each 0 Not Taking at Unknown time  . pantoprazole (PROTONIX) 40 MG tablet Take 1 tablet (40 mg  total) by mouth daily. (Patient not taking: Reported on 05/22/2015) 30 tablet 3 Not Taking at Unknown time  . traMADol (ULTRAM) 50 MG tablet Take 1 tablet (50 mg total) by mouth every 6 (six) hours as needed. (Patient not taking: Reported on 07/21/2015) 15 tablet 0 Not Taking at Unknown time    Review of Systems  Constitutional: Negative for fever and malaise/fatigue.  Gastrointestinal: Positive for abdominal pain. Negative for nausea, vomiting, diarrhea and constipation.  Genitourinary: Negative for dysuria, urgency and frequency.       Neg - vaginal bleeding   Physical Exam   Last menstrual period 07/02/2015.  Physical Exam  Nursing note and vitals reviewed. Constitutional: She is oriented to person, place, and time. She appears well-developed and well-nourished. No distress.  HENT:  Head: Normocephalic and atraumatic.  Cardiovascular: Normal rate.   Respiratory: Effort normal.  GI: Soft. She exhibits no distension and no mass. There is no tenderness. There is no rebound and no guarding.  Neurological: She is alert and oriented to person, place, and time.  Skin: Skin is warm and dry. No erythema.  Psychiatric: She has a normal mood and affect.   Results  for orders placed or performed during the hospital encounter of 07/24/15 (from the past 24 hour(s))  hCG, quantitative, pregnancy     Status: Abnormal   Collection Time: 07/24/15  3:00 PM  Result Value Ref Range   hCG, Beta Chain, Quant, S 229 (H) <5 mIU/mL  CBC with Differential/Platelet     Status: Abnormal   Collection Time: 07/24/15  3:34 PM  Result Value Ref Range   WBC 3.7 (L) 4.0 - 10.5 K/uL   RBC 4.29 3.87 - 5.11 MIL/uL   Hemoglobin 10.8 (L) 12.0 - 15.0 g/dL   HCT 35.6 (L) 36.0 - 46.0 %   MCV 83.0 78.0 - 100.0 fL   MCH 25.2 (L) 26.0 - 34.0 pg   MCHC 30.3 30.0 - 36.0 g/dL   RDW 17.6 (H) 11.5 - 15.5 %   Platelets 261 150 - 400 K/uL   Neutrophils Relative % 32 (L) 43 - 77 %   Neutro Abs 1.2 (L) 1.7 - 7.7 K/uL    Lymphocytes Relative 53 (H) 12 - 46 %   Lymphs Abs 1.9 0.7 - 4.0 K/uL   Monocytes Relative 14 (H) 3 - 12 %   Monocytes Absolute 0.5 0.1 - 1.0 K/uL   Eosinophils Relative 1 0 - 5 %   Eosinophils Absolute 0.0 0.0 - 0.7 K/uL   Basophils Relative 0 0 - 1 %   Basophils Absolute 0.0 0.0 - 0.1 K/uL   US Ob Transvaginal  07/24/2015   CLINICAL DATA:  Pelvic pain, LEFT adnexal mass. Positive pregnancy test.  EXAM: TRANSVAGINAL OB ULTRASOUND  TECHNIQUE: Transvaginal ultrasound was performed for complete evaluation of the gestation as well as the maternal uterus, adnexal regions, and pelvic cul-de-sac.  COMPARISON:  Ultrasound 07/21/2015  FINDINGS: Intrauterine gestational sac: Not identified  Yolk sac:  Not identified  Embryo:  Not identified  Maternal uterus/adnexae: Again demonstrated a echogenic mass adjacent to the LEFT ovary measuring 3.5 x 2.9 x 2.4 cm. There is a small cystic portion centrally within the echogenic mass measuring 5 mm. There is mild vascularity associated with the left adnexal mass. The normal LEFT ovary is noted adjacent to the mass.  The RIGHT ovary is normal.  Small amount free fluid the pelvis.  IMPRESSION: 1. Persistent LEFT adnexal mass adjacent to LEFT ovary with no intrauterine pregnancy is suspicious for LEFT adnexal ectopic pregnancy.  2.  LEFT adnexal mass measures 3.5 x 2.4 x 2 4 cm.  These results will be called to the ordering clinician or representative by the Radiologist Assistant, and communication documented in the PACS or zVision Dashboard.   Electronically Signed   By: Suzy Bouchard M.D.   On: 07/24/2015 16:27     MAU Course  Procedures None  MDM Discussed patient with Dr. Harolyn Rutherford who has reviewed the [place] from the [facility] on 07/21/15 and recommends quant hCG, [place] and CBC today Discussed [place] results with Dr. Harolyn Rutherford. She recommends surgical removal at this time. She is present in MAU to discuss these results and plan with the patient.   Assessment and Plan  A: Left  ectopic pregnancy  P: Patient to OR with Dr. Ulla Gallo, PA-C  07/24/2015, 5:15 PM

## 2015-07-24 NOTE — Anesthesia Postprocedure Evaluation (Signed)
Anesthesia Post Note  Patient: Candace Ramirez  Procedure(s) Performed: Procedure(s) (LRB): DIAGNOSTIC LAPAROSCOPY WITH REMOVAL OF ECTOPIC PREGNANCY (N/A)  Anesthesia type: General  Patient location: PACU  Post pain: Pain level controlled  Post assessment: Post-op Vital signs reviewed  Last Vitals:  Filed Vitals:   07/24/15 2015  BP: 100/48  Pulse: 71  Temp:   Resp: 12    Post vital signs: Reviewed  Level of consciousness: sedated  Complications: No apparent anesthesia complications

## 2015-07-24 NOTE — Anesthesia Preprocedure Evaluation (Addendum)
Anesthesia Evaluation  Patient identified by MRN, date of birth, ID band  Reviewed: Allergy & Precautions, NPO status , Patient's Chart, lab work & pertinent test results, reviewed documented beta blocker date and time   Airway Mallampati: II   Neck ROM: Full    Dental  (+) Teeth Intact, Dental Advisory Given   Pulmonary Current Smoker,  breath sounds clear to auscultation        Cardiovascular DVT Rhythm:Regular Rate:Normal     Neuro/Psych Anxiety    GI/Hepatic Neg liver ROS, hiatal hernia,   Endo/Other  Morbid obesity  Renal/GU      Musculoskeletal   Abdominal (+) + obese,   Peds  Hematology 11/36   Anesthesia Other Findings   Reproductive/Obstetrics L adnexal ectopic                            Anesthesia Physical Anesthesia Plan  ASA: II and emergent  Anesthesia Plan: General   Post-op Pain Management:    Induction: Rapid sequence, Intravenous and Cricoid pressure planned  Airway Management Planned: Oral ETT  Additional Equipment:   Intra-op Plan:   Post-operative Plan:   Informed Consent: I have reviewed the patients History and Physical, chart, labs and discussed the procedure including the risks, benefits and alternatives for the proposed anesthesia with the patient or authorized representative who has indicated his/her understanding and acceptance.     Plan Discussed with:   Anesthesia Plan Comments:         Anesthesia Quick Evaluation

## 2015-07-27 ENCOUNTER — Encounter (HOSPITAL_COMMUNITY): Payer: Self-pay | Admitting: Family Medicine

## 2015-07-29 ENCOUNTER — Other Ambulatory Visit: Payer: Medicaid Other

## 2015-08-06 ENCOUNTER — Ambulatory Visit: Payer: Medicaid Other | Admitting: Family Medicine

## 2015-09-25 ENCOUNTER — Ambulatory Visit: Payer: Medicaid Other | Admitting: Family Medicine

## 2015-09-28 ENCOUNTER — Ambulatory Visit: Payer: Medicaid Other | Admitting: Family Medicine

## 2016-08-22 ENCOUNTER — Ambulatory Visit (INDEPENDENT_AMBULATORY_CARE_PROVIDER_SITE_OTHER): Payer: Medicaid Other | Admitting: Internal Medicine

## 2016-08-22 ENCOUNTER — Encounter: Payer: Self-pay | Admitting: Internal Medicine

## 2016-08-22 VITALS — BP 125/93 | HR 81 | Temp 98.4°F | Ht 68.0 in | Wt 275.2 lb

## 2016-08-22 DIAGNOSIS — K429 Umbilical hernia without obstruction or gangrene: Secondary | ICD-10-CM | POA: Diagnosis not present

## 2016-08-22 NOTE — Patient Instructions (Signed)
Candace Ramirez,  We have referred you to surgery.  Signs of an emergency would be pain that becomes more severe and not being able to have any bowel movement. Also, vomiting that does not stop.  Best, Dr. Ola Spurr

## 2016-08-22 NOTE — Progress Notes (Signed)
[facility] Progress Note  Subjective:  Candace Ramirez is a 39-y/o female with history of previous umbilical hernia repair 3 years ago who presents for continued abdominal pain.   Abdominal pain: - Reports pain x 6 months with bulging over her belly button - For the last month, pain has worsened to be more continuous, requiring her to spend most of her time in bed - Laying on her back improves pain - Appetite has not decreased - Pain makes it hard to leave the house - Was seen in the ED in May and had hernia reduced. Was told referral to general surgery had been placed at that time.  - Had laparoscopic left salpingectomy 07/23/16 for ruptured ectopic pregnancy; denies complications from the operation and has resumed normal menstruation  - Patient says she has not been called for surgery appointment and does not know cause of delay ROS: Having regular bowel movements (though often hard), no n/v/d  No Known Allergies  Social: Current smoker (1 PPD)  Objective: Blood pressure (!) 125/93, pulse 81, temperature 98.4 F (36.9 C), temperature source Oral, height 5\' 8"  (1.727 m), weight 275 lb 3.2 oz (124.8 kg), last menstrual period 07/30/2016, SpO2 99 %, unknown if currently breastfeeding. Constitutional: Obese female in mild discomfort  Cardiovascular: RRR, S1, S2, no m/r/g.  Pulmonary/Chest: Effort normal and breath sounds normal. No respiratory distress.  Abdominal: Soft. +BS, TTP over umbilicus with reducible area of softness over umbilicus, no rebound or guarding.  Skin: Skin is warm and dry. No rash noted. No erythema.  Psychiatric: Normal mood and affect.  Vitals reviewed  Assessment/Plan: Umbilical hernia - Urgent general surgery referral placed. No evidence of bowel obstruction or incarceration of hernia, so unfortunately cannot be seen urgently. Earliest available appointment made for mid-September. CNA relayed message from [facility] that hold up in  having operation is likely due to patient being an active smoker. Advised patient that smoking cessation may expedite appointment date. - Did not prescribe opioid pain medication because could worsen bowel function and make patient less aware if hernia became incarcerated   Strict emergency return precautions provided.  Olene Floss, MD [place], PGY-2

## 2016-08-23 ENCOUNTER — Encounter: Payer: Self-pay | Admitting: Internal Medicine

## 2016-08-23 NOTE — Assessment & Plan Note (Addendum)
-   Urgent general surgery referral placed. No evidence of bowel obstruction or incarceration of hernia, so unfortunately cannot be seen urgently. Earliest available appointment made for mid-September. CNA relayed message from [facility] that hold up in having operation is likely due to patient being an active smoker. Advised patient that smoking cessation may expedite appointment date. - Did not prescribe opioid pain medication because could worsen bowel function and make patient less aware if hernia became incarcerated

## 2016-09-13 ENCOUNTER — Ambulatory Visit: Payer: Self-pay | Admitting: General Surgery

## 2016-10-06 ENCOUNTER — Encounter (HOSPITAL_COMMUNITY)
Admission: RE | Admit: 2016-10-06 | Discharge: 2016-10-06 | Disposition: A | Discharge status: Home / Self Care | Payer: Medicaid Other | Source: Ambulatory Visit | Attending: General Surgery | Admitting: General Surgery

## 2016-10-06 ENCOUNTER — Encounter (HOSPITAL_COMMUNITY): Payer: Self-pay

## 2016-10-06 DIAGNOSIS — Z01812 Encounter for preprocedural laboratory examination: Secondary | ICD-10-CM | POA: Diagnosis present

## 2016-10-06 LAB — BASIC METABOLIC PANEL
Anion gap: 8 (ref 5–15)
CO2: 21 mmol/L — ABNORMAL LOW (ref 22–32)
Calcium: 8.9 mg/dL (ref 8.9–10.3)
Chloride: 110 mmol/L (ref 101–111)
Creatinine, Ser: 0.98 mg/dL (ref 0.44–1.00)
GFR calc Af Amer: >60 mL/min (ref >60)
GLUCOSE: 107 mg/dL — AB (ref 65–99)
POTASSIUM: 3.5 mmol/L (ref 3.5–5.1)
Sodium: 139 mmol/L (ref 135–145)

## 2016-10-06 LAB — CBC
HEMATOCRIT: 35.1 % — AB (ref 36.0–46.0)
Hemoglobin: 10.4 g/dL — ABNORMAL LOW (ref 12.0–15.0)
MCH: 23.4 pg — ABNORMAL LOW (ref 26.0–34.0)
MCHC: 29.6 g/dL — AB (ref 30.0–36.0)
MCV: 78.9 fL (ref 78.0–100.0)
Platelets: 326 10*3/uL (ref 150–400)
RBC: 4.45 MIL/uL (ref 3.87–5.11)
RDW: 17 % — AB (ref 11.5–15.5)
WBC: 6.9 10*3/uL (ref 4.0–10.5)

## 2016-10-06 LAB — HCG, SERUM, QUALITATIVE: Preg, Serum: NEGATIVE

## 2016-10-06 NOTE — Pre-Procedure Instructions (Signed)
Candace Ramirez  10/06/2016      [id number], [place] [address] Phone: [phone] Fax: [phone]    Your procedure is scheduled on October 18  Report to [facility] [place].M.  Call this number if you have problems the morning of surgery:  [phone]   Remember:  Do not eat food or drink liquids after midnight.   Take these medicines the morning of surgery with A SIP OF WATER oxyCODONE-acetaminophen (PERCOCET/ROXICET), promethazine (PHENERGAN)  7 days prior to surgery STOP taking any Aspirin, Aleve, Naproxen, Ibuprofen, Motrin, Advil, Goody's, BC's, all herbal medications, fish oil, and all vitamins    Do not wear jewelry, make-up or nail polish.  Do not wear lotions, powders, or perfumes, or deoderant.  Do not shave 48 hours prior to surgery.    Do not bring valuables to the hospital.  [facility] is not responsible for any belongings or valuables.  Contacts, dentures or bridgework may not be worn into surgery.  Leave your suitcase in the car.  After surgery it may be brought to your room.  For patients admitted to the hospital, discharge time will be determined by your treatment team.  Patients discharged the day of surgery will not be allowed to drive home.    Special instructions:   [place]- Preparing For Surgery  Before surgery, you can play an important role. Because skin is not sterile, your skin needs to be as free of germs as possible. You can reduce the number of germs on your skin by washing with CHG (chlorahexidine gluconate) Soap before surgery.  CHG is an antiseptic cleaner which kills germs and bonds with the skin to continue killing germs even after washing.  Please do not use if you have an allergy to CHG or antibacterial soaps. If your skin becomes reddened/irritated stop using the CHG.  Do not shave (including legs and underarms) for at  least 48 hours prior to first CHG shower. It is OK to shave your face.  Please follow these instructions carefully.   1. Shower the NIGHT BEFORE SURGERY and the MORNING OF SURGERY with CHG.   2. If you chose to wash your hair, wash your hair first as usual with your normal shampoo.  3. After you shampoo, rinse your hair and body thoroughly to remove the shampoo.  4. Use CHG as you would any other liquid soap. You can apply CHG directly to the skin and wash gently with a scrungie or a clean washcloth.   5. Apply the CHG Soap to your body ONLY FROM THE NECK DOWN.  Do not use on open wounds or open sores. Avoid contact with your eyes, ears, mouth and genitals (private parts). Wash genitals (private parts) with your normal soap.  6. Wash thoroughly, paying special attention to the area where your surgery will be performed.  7. Thoroughly rinse your body with warm water from the neck down.  8. DO NOT shower/wash with your normal soap after using and rinsing off the CHG Soap.  9. Pat yourself dry with a CLEAN TOWEL.   10. Wear CLEAN PAJAMAS   11. Place CLEAN SHEETS on your bed the night of your first shower and DO NOT SLEEP WITH PETS.    Day of Surgery: Do not apply any deodorants/lotions. Please wear clean clothes to the hospital/surgery center.      Please read over the following  fact sheets that you were given. Coughing and Deep Breathing and Surgical Site Infection Prevention

## 2016-10-06 NOTE — Progress Notes (Signed)
PCP - [facility] Cardiologist - denies  Chest x-ray - not needed EKG - not needed Stress Test - denies ECHO - denies Cardiac Cath - denies    Patient denies shortness of breath, fever, cough and chest pain at PAT appointment

## 2016-10-11 MED ORDER — DEXTROSE 5 % IV SOLN
3.0000 g | INTRAVENOUS | Status: AC
  Administered 2016-10-12: 3 g via INTRAVENOUS
  Filled 2016-10-11: qty 3000

## 2016-10-12 ENCOUNTER — Ambulatory Visit (HOSPITAL_COMMUNITY)
Admission: RE | Admit: 2016-10-12 | Discharge: 2016-10-14 | Disposition: A | Discharge status: Home / Self Care | Payer: Medicaid Other | Source: Ambulatory Visit | Attending: General Surgery | Admitting: General Surgery

## 2016-10-12 ENCOUNTER — Encounter (HOSPITAL_COMMUNITY)
Admission: RE | Disposition: A | Discharge status: Home / Self Care | Payer: Self-pay | Source: Ambulatory Visit | Attending: General Surgery

## 2016-10-12 ENCOUNTER — Ambulatory Visit (HOSPITAL_COMMUNITY): Payer: Medicaid Other | Admitting: Emergency Medicine

## 2016-10-12 ENCOUNTER — Ambulatory Visit (HOSPITAL_COMMUNITY): Payer: Medicaid Other | Admitting: Anesthesiology

## 2016-10-12 ENCOUNTER — Encounter (HOSPITAL_COMMUNITY): Payer: Self-pay | Admitting: *Deleted

## 2016-10-12 DIAGNOSIS — K432 Incisional hernia without obstruction or gangrene: Secondary | ICD-10-CM | POA: Diagnosis not present

## 2016-10-12 DIAGNOSIS — K449 Diaphragmatic hernia without obstruction or gangrene: Secondary | ICD-10-CM | POA: Diagnosis not present

## 2016-10-12 DIAGNOSIS — Z6839 Body mass index (BMI) 39.0-39.9, adult: Secondary | ICD-10-CM | POA: Diagnosis not present

## 2016-10-12 DIAGNOSIS — I739 Peripheral vascular disease, unspecified: Secondary | ICD-10-CM | POA: Insufficient documentation

## 2016-10-12 DIAGNOSIS — F17210 Nicotine dependence, cigarettes, uncomplicated: Secondary | ICD-10-CM | POA: Insufficient documentation

## 2016-10-12 HISTORY — PX: INSERTION OF MESH: SHX5868

## 2016-10-12 HISTORY — DX: Incisional hernia without obstruction or gangrene: K43.2

## 2016-10-12 HISTORY — PX: VENTRAL HERNIA REPAIR: SHX424

## 2016-10-12 HISTORY — PX: LAPAROSCOPIC INCISIONAL / UMBILICAL / VENTRAL HERNIA REPAIR: SUR789

## 2016-10-12 SURGERY — REPAIR, HERNIA, VENTRAL, LAPAROSCOPIC
Anesthesia: General | Site: Abdomen

## 2016-10-12 MED ORDER — ONDANSETRON HCL 4 MG/2ML IJ SOLN
INTRAMUSCULAR | Status: AC
  Filled 2016-10-12: qty 2

## 2016-10-12 MED ORDER — HEPARIN SODIUM (PORCINE) 5000 UNIT/ML IJ SOLN
5000.0000 [IU] | Freq: Three times a day (TID) | INTRAMUSCULAR | Status: DC
  Administered 2016-10-13 – 2016-10-14 (×4): 5000 [IU] via SUBCUTANEOUS
  Filled 2016-10-12 (×3): qty 1

## 2016-10-12 MED ORDER — FAMOTIDINE IN NACL 20-0.9 MG/50ML-% IV SOLN
20.0000 mg | Freq: Two times a day (BID) | INTRAVENOUS | Status: DC
  Administered 2016-10-12 (×2): 20 mg via INTRAVENOUS
  Filled 2016-10-12 (×4): qty 50

## 2016-10-12 MED ORDER — MORPHINE SULFATE (PF) 2 MG/ML IV SOLN: 1.0000 mg | INTRAVENOUS | Status: DC | PRN

## 2016-10-12 MED ORDER — MIDAZOLAM HCL 2 MG/2ML IJ SOLN
INTRAMUSCULAR | Status: AC
  Filled 2016-10-12: qty 2

## 2016-10-12 MED ORDER — METOPROLOL TARTRATE 5 MG/5ML IV SOLN
INTRAVENOUS | Status: DC | PRN
  Administered 2016-10-12: 2 mg via INTRAVENOUS
  Administered 2016-10-12: 1 mg via INTRAVENOUS

## 2016-10-12 MED ORDER — MIDAZOLAM HCL 5 MG/5ML IJ SOLN
INTRAMUSCULAR | Status: DC | PRN
  Administered 2016-10-12: 2 mg via INTRAVENOUS

## 2016-10-12 MED ORDER — 0.9 % SODIUM CHLORIDE (POUR BTL) OPTIME
TOPICAL | Status: DC | PRN
  Administered 2016-10-12: 1000 mL

## 2016-10-12 MED ORDER — SUGAMMADEX SODIUM 500 MG/5ML IV SOLN
INTRAVENOUS | Status: DC | PRN
  Administered 2016-10-12: 150 mg via INTRAVENOUS

## 2016-10-12 MED ORDER — LIDOCAINE 2% (20 MG/ML) 5 ML SYRINGE
INTRAMUSCULAR | Status: AC
  Filled 2016-10-12: qty 5

## 2016-10-12 MED ORDER — FENTANYL CITRATE (PF) 100 MCG/2ML IJ SOLN
INTRAMUSCULAR | Status: AC
  Filled 2016-10-12: qty 2

## 2016-10-12 MED ORDER — SUGAMMADEX SODIUM 500 MG/5ML IV SOLN
INTRAVENOUS | Status: AC
  Filled 2016-10-12: qty 5

## 2016-10-12 MED ORDER — ONDANSETRON 4 MG PO TBDP
4.0000 mg | ORAL_TABLET | Freq: Four times a day (QID) | ORAL | Status: DC | PRN
  Administered 2016-10-13: 4 mg via ORAL
  Filled 2016-10-12: qty 1

## 2016-10-12 MED ORDER — PROPOFOL 10 MG/ML IV BOLUS
INTRAVENOUS | Status: AC
  Filled 2016-10-12: qty 20

## 2016-10-12 MED ORDER — BUPIVACAINE-EPINEPHRINE 0.25% -1:200000 IJ SOLN
INTRAMUSCULAR | Status: DC | PRN
  Administered 2016-10-12: 30 mL

## 2016-10-12 MED ORDER — ROCURONIUM BROMIDE 10 MG/ML (PF) SYRINGE
PREFILLED_SYRINGE | INTRAVENOUS | Status: AC
  Filled 2016-10-12: qty 10

## 2016-10-12 MED ORDER — KCL IN DEXTROSE-NACL 20-5-0.9 MEQ/L-%-% IV SOLN
INTRAVENOUS | Status: DC
  Administered 2016-10-12 – 2016-10-13 (×2): via INTRAVENOUS
  Filled 2016-10-12 (×3): qty 1000

## 2016-10-12 MED ORDER — ONDANSETRON HCL 4 MG/2ML IJ SOLN
4.0000 mg | Freq: Four times a day (QID) | INTRAMUSCULAR | Status: DC | PRN
  Administered 2016-10-12 (×2): 4 mg via INTRAVENOUS
  Filled 2016-10-12 (×2): qty 2

## 2016-10-12 MED ORDER — DEXAMETHASONE SODIUM PHOSPHATE 10 MG/ML IJ SOLN
INTRAMUSCULAR | Status: AC
  Filled 2016-10-12: qty 1

## 2016-10-12 MED ORDER — FENTANYL CITRATE (PF) 100 MCG/2ML IJ SOLN
INTRAMUSCULAR | Status: AC
  Filled 2016-10-12: qty 4

## 2016-10-12 MED ORDER — ESMOLOL HCL 100 MG/10ML IV SOLN
INTRAVENOUS | Status: DC | PRN
  Administered 2016-10-12 (×2): 30 mg via INTRAVENOUS
  Administered 2016-10-12 (×2): 20 mg via INTRAVENOUS

## 2016-10-12 MED ORDER — BUPIVACAINE-EPINEPHRINE 0.25% -1:200000 IJ SOLN
INTRAMUSCULAR | Status: AC
  Filled 2016-10-12: qty 1

## 2016-10-12 MED ORDER — ALBUTEROL SULFATE HFA 108 (90 BASE) MCG/ACT IN AERS
INHALATION_SPRAY | RESPIRATORY_TRACT | Status: DC | PRN
  Administered 2016-10-12: 6 via RESPIRATORY_TRACT

## 2016-10-12 MED ORDER — LIDOCAINE HCL (CARDIAC) 20 MG/ML IV SOLN
INTRAVENOUS | Status: DC | PRN
  Administered 2016-10-12: 100 mg via INTRAVENOUS

## 2016-10-12 MED ORDER — PROMETHAZINE HCL 25 MG/ML IJ SOLN: 6.2500 mg | INTRAMUSCULAR | Status: DC | PRN

## 2016-10-12 MED ORDER — DEXAMETHASONE SODIUM PHOSPHATE 10 MG/ML IJ SOLN
INTRAMUSCULAR | Status: DC | PRN
  Administered 2016-10-12: 5 mg via INTRAVENOUS

## 2016-10-12 MED ORDER — FENTANYL CITRATE (PF) 100 MCG/2ML IJ SOLN
INTRAMUSCULAR | Status: DC | PRN
  Administered 2016-10-12 (×4): 100 ug via INTRAVENOUS

## 2016-10-12 MED ORDER — PROPOFOL 10 MG/ML IV BOLUS
INTRAVENOUS | Status: DC | PRN
  Administered 2016-10-12: 200 mg via INTRAVENOUS

## 2016-10-12 MED ORDER — HYDROMORPHONE HCL 2 MG/ML IJ SOLN
INTRAMUSCULAR | Status: AC
  Filled 2016-10-12: qty 1

## 2016-10-12 MED ORDER — CHLORHEXIDINE GLUCONATE CLOTH 2 % EX PADS: 6.0000 | MEDICATED_PAD | Freq: Once | CUTANEOUS | Status: DC

## 2016-10-12 MED ORDER — HYDROMORPHONE HCL 2 MG/ML IJ SOLN
0.2500 mg | INTRAMUSCULAR | Status: DC | PRN
Start: 2016-10-12 — End: 2016-10-12
  Administered 2016-10-12 (×2): 0.5 mg via INTRAVENOUS

## 2016-10-12 MED ORDER — PROMETHAZINE HCL 25 MG PO TABS: 25.0000 mg | ORAL_TABLET | Freq: Four times a day (QID) | ORAL | Status: DC | PRN

## 2016-10-12 MED ORDER — ONDANSETRON HCL 4 MG/2ML IJ SOLN
INTRAMUSCULAR | Status: DC | PRN
  Administered 2016-10-12: 4 mg via INTRAVENOUS

## 2016-10-12 MED ORDER — SUCCINYLCHOLINE CHLORIDE 20 MG/ML IJ SOLN
INTRAMUSCULAR | Status: DC | PRN
  Administered 2016-10-12: 100 mg via INTRAVENOUS

## 2016-10-12 MED ORDER — SUCCINYLCHOLINE CHLORIDE 200 MG/10ML IV SOSY
PREFILLED_SYRINGE | INTRAVENOUS | Status: AC
  Filled 2016-10-12: qty 10

## 2016-10-12 MED ORDER — LACTATED RINGERS IV SOLN
INTRAVENOUS | Status: DC | PRN
  Administered 2016-10-12 (×2): via INTRAVENOUS

## 2016-10-12 MED ORDER — ROCURONIUM BROMIDE 100 MG/10ML IV SOLN
INTRAVENOUS | Status: DC | PRN
  Administered 2016-10-12 (×2): 20 mg via INTRAVENOUS
  Administered 2016-10-12: 50 mg via INTRAVENOUS

## 2016-10-12 MED ORDER — OXYCODONE-ACETAMINOPHEN 5-325 MG PO TABS
1.0000 | ORAL_TABLET | Freq: Four times a day (QID) | ORAL | Status: DC | PRN
  Administered 2016-10-12 – 2016-10-13 (×4): 2 via ORAL
  Filled 2016-10-12 (×4): qty 2

## 2016-10-12 MED ORDER — METHOCARBAMOL 500 MG PO TABS
500.0000 mg | ORAL_TABLET | Freq: Four times a day (QID) | ORAL | Status: DC | PRN
Start: 2016-10-12 — End: 2016-10-14
  Administered 2016-10-13: 500 mg via ORAL
  Filled 2016-10-12: qty 1

## 2016-10-12 SURGICAL SUPPLY — 48 items
ADH SKN CLS APL DERMABOND .7 (GAUZE/BANDAGES/DRESSINGS) ×1
APPLIER CLIP LOGIC TI 5 (MISCELLANEOUS) IMPLANT
APPLIER CLIP ROT 10 11.4 M/L (STAPLE)
APR CLP MED LRG 11.4X10 (STAPLE)
APR CLP MED LRG 33X5 (MISCELLANEOUS)
BINDER ABDOMINAL 12 ML 46-62 (SOFTGOODS) ×2 IMPLANT
BNDG GAUZE ELAST 4 BULKY (GAUZE/BANDAGES/DRESSINGS) IMPLANT
CANISTER SUCTION 2500CC (MISCELLANEOUS) ×2 IMPLANT
CHLORAPREP W/TINT 26ML (MISCELLANEOUS) ×3 IMPLANT
CLIP APPLIE ROT 10 11.4 M/L (STAPLE) IMPLANT
COVER SURGICAL LIGHT HANDLE (MISCELLANEOUS) ×3 IMPLANT
DERMABOND ADVANCED (GAUZE/BANDAGES/DRESSINGS) ×2
DERMABOND ADVANCED .7 DNX12 (GAUZE/BANDAGES/DRESSINGS) ×1 IMPLANT
DEVICE SECURE STRAP 25 ABSORB (INSTRUMENTS) ×5 IMPLANT
DEVICE TROCAR PUNCTURE CLOSURE (ENDOMECHANICALS) ×3 IMPLANT
DRAPE INCISE IOBAN 66X45 STRL (DRAPES) ×3 IMPLANT
DRAPE LAPAROSCOPIC ABDOMINAL (DRAPES) ×3 IMPLANT
ELECT CAUTERY BLADE 6.4 (BLADE) ×3 IMPLANT
ELECT REM PT RETURN 9FT ADLT (ELECTROSURGICAL) ×3
ELECTRODE REM PT RTRN 9FT ADLT (ELECTROSURGICAL) ×1 IMPLANT
GLOVE BIO SURGEON STRL SZ7.5 (GLOVE) ×3 IMPLANT
GOWN STRL REUS W/ TWL LRG LVL3 (GOWN DISPOSABLE) ×3 IMPLANT
GOWN STRL REUS W/TWL LRG LVL3 (GOWN DISPOSABLE) ×9
KIT BASIN OR (CUSTOM PROCEDURE TRAY) ×3 IMPLANT
KIT ROOM TURNOVER OR (KITS) ×3 IMPLANT
MARKER SKIN DUAL TIP RULER LAB (MISCELLANEOUS) ×3 IMPLANT
MESH VENTRALIGHT ST 4X6IN (Mesh General) ×2 IMPLANT
NDL SPNL 22GX3.5 QUINCKE BK (NEEDLE) ×1 IMPLANT
NEEDLE SPNL 22GX3.5 QUINCKE BK (NEEDLE) ×3 IMPLANT
NS IRRIG 1000ML POUR BTL (IV SOLUTION) ×3 IMPLANT
PAD ARMBOARD 7.5X6 YLW CONV (MISCELLANEOUS) ×6 IMPLANT
PENCIL BUTTON HOLSTER BLD 10FT (ELECTRODE) ×3 IMPLANT
SCALPEL HARMONIC ACE (MISCELLANEOUS) ×2 IMPLANT
SCISSORS LAP 5X35 DISP (ENDOMECHANICALS) ×2 IMPLANT
SET IRRIG TUBING LAPAROSCOPIC (IRRIGATION / IRRIGATOR) IMPLANT
SLEEVE ENDOPATH XCEL 5M (ENDOMECHANICALS) ×5 IMPLANT
SUT MNCRL AB 4-0 PS2 18 (SUTURE) ×5 IMPLANT
SUT NOVA NAB DX-16 0-1 5-0 T12 (SUTURE) ×5 IMPLANT
SUT VIC AB 3-0 SH 27 (SUTURE) ×3
SUT VIC AB 3-0 SH 27XBRD (SUTURE) ×1 IMPLANT
TOWEL OR 17X24 6PK STRL BLUE (TOWEL DISPOSABLE) ×3 IMPLANT
TOWEL OR 17X26 10 PK STRL BLUE (TOWEL DISPOSABLE) ×3 IMPLANT
TRAY FOLEY CATH 16FR SILVER (SET/KITS/TRAYS/PACK) ×3 IMPLANT
TRAY LAPAROSCOPIC MC (CUSTOM PROCEDURE TRAY) ×3 IMPLANT
TROCAR XCEL BLUNT TIP 100MML (ENDOMECHANICALS) IMPLANT
TROCAR XCEL NON-BLD 11X100MML (ENDOMECHANICALS) IMPLANT
TROCAR XCEL NON-BLD 5MMX100MML (ENDOMECHANICALS) ×3 IMPLANT
TUBING INSUFFLATION (TUBING) ×3 IMPLANT

## 2016-10-12 NOTE — Anesthesia Postprocedure Evaluation (Signed)
Anesthesia Post Note  Patient: Karys Kwasnik Hangartner  Procedure(s) Performed: Procedure(s) (LRB): LAPAROSCOPIC VENTRAL HERNIA (N/A) INSERTION OF MESH (N/A)  Patient location during evaluation: PACU Anesthesia Type: General Level of consciousness: awake and alert Pain management: pain level controlled Vital Signs Assessment: post-procedure vital signs reviewed and stable Respiratory status: spontaneous breathing, nonlabored ventilation, respiratory function stable and patient connected to nasal cannula oxygen Cardiovascular status: blood pressure returned to baseline and stable Postop Assessment: no signs of nausea or vomiting Anesthetic complications: no    Last Vitals:  Vitals:   10/12/16 1130 10/12/16 1140  BP: 115/84   Pulse: 91 94  Resp: 18 18  Temp:  36.9 C    Last Pain:  Vitals:   10/12/16 1115  TempSrc:   PainSc: Asleep                 Tyton Abdallah,JAMES TERRILL

## 2016-10-12 NOTE — Transfer of Care (Signed)
Immediate Anesthesia Transfer of Care Note  Patient: Candace Ramirez  Procedure(s) Performed: Procedure(s): LAPAROSCOPIC VENTRAL HERNIA (N/A) INSERTION OF MESH (N/A)  Patient Location: PACU  Anesthesia Type:General  Level of Consciousness: awake, alert , oriented and patient cooperative  Airway & Oxygen Therapy: Patient Spontanous Breathing and Patient connected to nasal cannula oxygen  Post-op Assessment: Report given to RN and Post -op Vital signs reviewed and stable  Post vital signs: Reviewed and stable  Last Vitals:  Vitals:   10/12/16 0734 10/12/16 0738  BP:  (!) 119/56  Pulse: 72   Resp: 20   Temp: 36.7 C     Last Pain:  Vitals:   10/12/16 0734  TempSrc: Oral  PainSc:       Patients Stated Pain Goal: 3 ([id number])  Complications: No apparent anesthesia complications

## 2016-10-12 NOTE — Op Note (Signed)
10/12/2016  10:07 AM  PATIENT:  Candace Ramirez  40 y.o. female  PRE-OPERATIVE DIAGNOSIS:  VENTRAL HERNIA RECURRENT  POST-OPERATIVE DIAGNOSIS:  VENTRAL HERNIA RECURRENT  PROCEDURE:  Procedure(s): LAPAROSCOPIC VENTRAL HERNIA Repair with Mesh INSERTION OF MESH (N/A)  SURGEON:  Surgeon(s) and Role:    * Jovita Kussmaul, MD - Primary  PHYSICIAN ASSISTANT:   ASSISTANTS: Judyann Munson, RNFA   ANESTHESIA:   general  EBL:  Total I/O In: 1000 [I.V.:1000] Out: 72 [Urine:50; Blood:20]  BLOOD ADMINISTERED:none  DRAINS: none   LOCAL MEDICATIONS USED:  MARCAINE     SPECIMEN:  No Specimen  DISPOSITION OF SPECIMEN:  N/A  COUNTS:  YES  TOURNIQUET:  * No tourniquets in log *  DICTATION: .Dragon Dictation   After informed consent was obtained the patient was brought to the operating room and placed in the supine position on the operating room table. After adequate induction of general anesthesia the patient's abdomen was prepped with ChloraPrep, allowed to dry, and draped in usual sterile manner including the use of an Ioban drape. An appropriate timeout was performed. An area on the right mid abdomen was infiltrated with quarter percent Marcaine. A small stab incision was made with a 15 blade knife. A 5 mm Optiview port and camera were used to bluntly dissect through the layers of the abdominal wall under direct vision until access was gained to the abdominal cavity. The abdomen was then insufflated with carbon dioxide without difficulty. The abdomen was inspected and there was no injury identified. The abdominal wall had omentum adherent to a ventral hernia. Another 5 mm port was placed bluntly through the abdominal wall under direct vision in the right lower quadrant. A third 5 #4 was placed on the left mid abdomen under direct vision. The omentum was taken down sharply from the abdominal wall with the Harmonic scalpel. Once this was accomplished we could identify the hernia defect. The  size of the defect was estimated using a spinal needle. I chose a 10 x 15 cm piece of ventral light mesh. 4 #1 Novafil stitches were placed at equidistant points around the edge of the mesh. The mesh was oriented with the coated side down. Next a small incision was made through the middle of the hernia defect with a 15 blade knife. The incision was carried through the skin and subcutaneous venous tissue sharply with electrocautery until the hernia was entered. The hernia sac was excised sharply with the electrocautery. The mesh was then moistened with saline and placed into the abdominal cavity with the appropriate orientation through this incision. Next the fascial defect was closed with 2 figure-of-eight #1 Novafil stitches. The abdomen was then insufflated again. The incision was airtight. 4 stab incisions were made at points that corresponded to the placement of the stitches. A suture passer was used to bring the tails of each stitch through the abdominal wall. Each stitch was then cinched down and tied. Once this was accomplished there was good apposition of the mesh to the abdominal wall without any redundancy. The gaps between the stitches were filled then with a secure strap tacker. Once this was accomplished the mesh was in good apposition to the abdominal wall without any gaps or redundancy. The area was examined and found to be hemostatic. At this point the gas was allowed to escape. On general inspection of the abdomen there was no other abnormalities noted. The deep layer of the periumbilical incision was closed with interrupted 3-0 Vicryl stitches. All  the skin incisions were then closed with interrupted 4-0 Monocryl subcuticular stitches. Dermabond dressings were applied. The patient tolerated the procedure well. At the end of the case all needle sponge and instrument counts were correct. The patient was then awakened and taken to recovery in stable condition.  PLAN OF CARE: Admit for overnight  observation  PATIENT DISPOSITION:  PACU - hemodynamically stable.   Delay start of Pharmacological VTE agent (>24hrs) due to surgical blood loss or risk of bleeding: no

## 2016-10-12 NOTE — Interval H&P Note (Signed)
History and Physical Interval Note:  10/12/2016 8:22 AM  Candace Ramirez  has presented today for surgery, with the diagnosis of VENTRAL HERNIA RECURRENT  The various methods of treatment have been discussed with the patient and family. After consideration of risks, benefits and other options for treatment, the patient has consented to  Procedure(s): LAPAROSCOPIC VENTRAL HERNIA (N/A) INSERTION OF MESH (N/A) as a surgical intervention .  The patient's history has been reviewed, patient examined, no change in status, stable for surgery.  I have reviewed the patient's chart and labs.  Questions were answered to the patient's satisfaction.     TOTH III,PAUL S

## 2016-10-12 NOTE — H&P (Signed)
Candace Ramirez  Location: [facility] Patient #: [id number] DOB: 05-Jun-1976 Single / Language: Cleophus Molt / Race: Black or African American Female   History of Present Illness  The patient is a 40 year old female who presents for a follow-up for Abdominal pain. 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 At that time I scheduled her for surgery but she did not follow through with it. She returns today continuing to have pain at the umbilicus. She has some occasional nausea but no vomiting. 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 She denies any significant weight loss. Her appetite has been good and her bowels are working normally although she feels like she has to push more to get it out.   Allergies No Known Drug Allergies  Medication History Ibuprofen (200MG  Tablet, Oral) Active. Medications Reconciled    Review of Systems  General Present- Night Sweats. Not Present- Appetite Loss, Chills, Fatigue, Fever, Weight Gain and Weight Loss. Skin Present- Dryness. Not Present- Change in Wart/Mole, Hives, Jaundice, New Lesions, Non-Healing Wounds, Rash and Ulcer. HEENT Present- Wears glasses/contact lenses. Not Present- Earache, Hearing Loss, Hoarseness, Nose Bleed, Oral Ulcers, Ringing in the Ears, Seasonal Allergies, Sinus Pain, Sore Throat, Visual Disturbances and Yellow Eyes. Respiratory Not Present- Bloody sputum, Chronic Cough, Difficulty Breathing, Snoring and Wheezing. Breast Not Present- Breast Mass, Breast Pain, Nipple Discharge and Skin Changes. Cardiovascular Not Present- Chest Pain, Difficulty Breathing Lying Down, Leg Cramps, Palpitations, Rapid Heart Rate, Shortness of Breath and Swelling of Extremities. Gastrointestinal Present- Abdominal Pain and Constipation. Not Present-  Bloating, Bloody Stool, Change in Bowel Habits, Chronic diarrhea, Difficulty Swallowing, Excessive gas, Gets full quickly at meals, Hemorrhoids, Indigestion, Nausea, Rectal Pain and Vomiting. Female Genitourinary Not Present- Frequency, Nocturia, Painful Urination, Pelvic Pain and Urgency. Musculoskeletal Not Present- Back Pain, Joint Pain, Joint Stiffness, Muscle Pain, Muscle Weakness and Swelling of Extremities. Neurological Not Present- Decreased Memory, Fainting, Headaches, Numbness, Seizures, Tingling, Tremor, Trouble walking and Weakness. Psychiatric Not Present- Anxiety, Bipolar, Change in Sleep Pattern, Depression, Fearful and Frequent crying. Endocrine Not Present- Cold Intolerance, Excessive Hunger, Hair Changes, Heat Intolerance, Hot flashes and New Diabetes. Hematology Not Present- Easy Bruising, Excessive bleeding, Gland problems, HIV and Persistent Infections.  Vitals Weight: 282 lb Height: 69in Body Surface Area: 2.39 m Body Mass Index: 41.64 kg/m  Temp.: 98.31F(Temporal)  Pulse: 110 (Regular)  BP: 138/88 (Sitting, Left Arm, Standard)       Physical Exam  General Mental Status-Alert. General Appearance-Consistent with stated age. Hydration-Well hydrated. Voice-Normal.  Head and Neck Head-normocephalic, atraumatic with no lesions or palpable masses. Trachea-midline. Thyroid Gland Characteristics - normal size and consistency.  Eye Eyeball - Bilateral-Extraocular movements intact. Sclera/Conjunctiva - Bilateral-No scleral icterus.  Chest and Lung Exam Chest and lung exam reveals -quiet, even and easy respiratory effort with no use of accessory muscles and on auscultation, normal breath sounds, no adventitious sounds and normal vocal resonance. Inspection Chest Wall - Normal. Back - normal.  Cardiovascular Cardiovascular examination reveals -normal heart sounds, regular rate and rhythm with no murmurs and normal pedal pulses  bilaterally.  Abdomen Note: The abdomen is soft. She is morbidly obese. There is tenderness above the umbilicus. Because of her size it is difficult to appreciate a bulge or fascial defect.   Neurologic Neurologic evaluation reveals -  alert and oriented x 3 with no impairment of recent or remote memory. Mental Status-Normal.  Musculoskeletal Normal Exam - Left-Upper Extremity Strength Normal and Lower Extremity Strength Normal. Normal Exam - Right-Upper Extremity Strength Normal and Lower Extremity Strength Normal.  Lymphatic Head & Neck  General Head & Neck Lymphatics: Bilateral - Description - Normal. Axillary  General Axillary Region: Bilateral - Description - Normal. Tenderness - Non Tender. Femoral & Inguinal  Generalized Femoral & Inguinal Lymphatics: Bilateral - Description - Normal. Tenderness - Non Tender.    Assessment & Plan  VENTRAL INCISIONAL HERNIA WITHOUT OBSTRUCTION OR GANGRENE (K43.2) Impression: The patient appears to have a recurrent ventral hernia. We saw her almost 2 years ago at which time we plan for a laparoscopic ventral hernia repair with mesh. Unfortunate she does not schedule surgery. She is now ready to schedule. After reviewing her CT scan from her last visit I do think she would be a good candidate for laparoscopic type repair. I have discussed with her in detail the risks and benefits of the operation to fix the hernia as well as some of the technical aspects including the use of mesh and the risk of chronic pain and she understands and wishes to proceed Current Plans Pt Education - Hernia: discussed with patient and provided information.

## 2016-10-12 NOTE — Anesthesia Procedure Notes (Signed)
Procedure Name: Intubation Date/Time: 10/12/2016 8:38 AM Performed by: Candace Ramirez Pre-anesthesia Checklist: Patient identified, Emergency Drugs available, Suction available and Patient being monitored Patient Re-evaluated:Patient Re-evaluated prior to inductionOxygen Delivery Method: Circle System Utilized Preoxygenation: Pre-oxygenation with 100% oxygen Intubation Type: IV induction Ventilation: Mask ventilation without difficulty Laryngoscope Size: Mac and 3 Grade View: Grade I Tube type: Oral Tube size: 7.5 mm Number of attempts: 1 Airway Equipment and Method: Stylet Placement Confirmation: ETT inserted through vocal cords under direct vision,  positive ETCO2 and breath sounds checked- equal and bilateral Secured at: 23 cm Tube secured with: Tape Dental Injury: Teeth and Oropharynx as per pre-operative assessment

## 2016-10-12 NOTE — Anesthesia Preprocedure Evaluation (Addendum)
Anesthesia Evaluation  Patient identified by MRN, date of birth, ID band Patient awake    Reviewed: Allergy & Precautions, NPO status , Patient's Chart, lab work & pertinent test results  History of Anesthesia Complications Negative for: history of anesthetic complications  Airway Mallampati: I  TM Distance: >3 FB Neck ROM: Full    Dental  (+) Teeth Intact   Pulmonary neg pulmonary ROS, Current Smoker,    breath sounds clear to auscultation       Cardiovascular + Peripheral Vascular Disease   Rhythm:Regular Rate:Normal     Neuro/Psych negative neurological ROS     GI/Hepatic hiatal hernia, (+)     substance abuse  ,   Endo/Other  negative endocrine ROSMorbid obesity  Renal/GU negative Renal ROS     Musculoskeletal   Abdominal (+) + obese,   Peds  Hematology   Anesthesia Other Findings   Reproductive/Obstetrics                            Anesthesia Physical Anesthesia Plan  ASA: II  Anesthesia Plan: General   Post-op Pain Management:    Induction: Intravenous  Airway Management Planned: Oral ETT  Additional Equipment:   Intra-op Plan:   Post-operative Plan: Extubation in OR  Informed Consent: I have reviewed the patients History and Physical, chart, labs and discussed the procedure including the risks, benefits and alternatives for the proposed anesthesia with the patient or authorized representative who has indicated his/her understanding and acceptance.     Plan Discussed with: CRNA  Anesthesia Plan Comments:         Anesthesia Quick Evaluation

## 2016-10-13 ENCOUNTER — Encounter (HOSPITAL_COMMUNITY): Payer: Self-pay | Admitting: General Surgery

## 2016-10-13 DIAGNOSIS — K432 Incisional hernia without obstruction or gangrene: Secondary | ICD-10-CM | POA: Diagnosis not present

## 2016-10-13 MED ORDER — OXYCODONE-ACETAMINOPHEN 5-325 MG PO TABS: 1.0000 | ORAL_TABLET | ORAL | 0 refills | Status: DC | PRN

## 2016-10-13 MED ORDER — FAMOTIDINE 20 MG PO TABS
20.0000 mg | ORAL_TABLET | Freq: Two times a day (BID) | ORAL | Status: DC
  Administered 2016-10-13 – 2016-10-14 (×3): 20 mg via ORAL
  Filled 2016-10-13 (×3): qty 1

## 2016-10-13 MED ORDER — ONDANSETRON HCL 4 MG PO TABS: 4.0000 mg | ORAL_TABLET | Freq: Three times a day (TID) | ORAL | 2 refills | Status: DC | PRN

## 2016-10-13 NOTE — Progress Notes (Signed)
1 Day Post-Op  Subjective: No complaints other than some manageable soreness  Objective: Vital signs in last 24 hours: Temp:  [98.1 F (36.7 C)-98.5 F (36.9 C)] 98.4 F (36.9 C) (10/19 0556) Pulse Rate:  [72-105] 77 (10/19 0556) Resp:  [15-20] 18 (10/19 0556) BP: (115-140)/(56-91) 127/67 (10/19 0556) SpO2:  [91 %-100 %] 100 % (10/19 0556) Weight:  [122 kg (269 lb)] 122 kg (269 lb) (10/18 0727) Last BM Date: 10/12/16  Intake/Output from previous day: 10/18 0701 - 10/19 0700 In: 1990 [P.O.:990; I.V.:1000] Out: 1520 [Urine:1500; Blood:20] Intake/Output this shift: Total I/O In: 510 [P.O.:510] Out: 1200 [Urine:1200]  Resp: clear to auscultation bilaterally Cardio: regular rate and rhythm GI: soft, mild tenderness. incisions look good  Lab Results:  No results for input(s): WBC, HGB, HCT, PLT in the last 72 hours. BMET No results for input(s): NA, K, CL, CO2, GLUCOSE, BUN, CREATININE, CALCIUM in the last 72 hours. PT/INR No results for input(s): LABPROT, INR in the last 72 hours. ABG No results for input(s): PHART, HCO3 in the last 72 hours.  Invalid input(s): PCO2, PO2  Studies/Results: No results found.  Anti-infectives: Anti-infectives    Start     Dose/Rate Route Frequency Ordered Stop   10/12/16 0800  ceFAZolin (ANCEF) 3 g in dextrose 5 % 50 mL IVPB     3 g 130 mL/hr over 30 Minutes Intravenous To ShortStay Surgical 10/11/16 1422 10/12/16 0854      Assessment/Plan: s/p Procedure(s): LAPAROSCOPIC VENTRAL HERNIA (N/A) INSERTION OF MESH (N/A) Advance diet  Discharge if she tolerates diet  LOS: 0 days    TOTH III,Chuong Casebeer S 10/13/2016

## 2016-10-13 NOTE — Discharge Summary (Signed)
Physician Discharge Summary  Patient ID: Candace Ramirez MRN: [id number] DOB/AGE: 1976-03-02 40 y.o.  Admit date: 10/12/2016 Discharge date: 10/13/2016  Admission Diagnoses:  Discharge Diagnoses:  Active Problems:   Recurrent ventral hernia   Discharged Condition: good  Hospital Course: the patient underwent lap ventral hernia repair with mesh. She tolerated surgery well. On pod 1 she was ready for discharge home  Consults: None  Significant Diagnostic Studies: none  Treatments: surgery: as above  Discharge Exam: Blood pressure 127/67, pulse 77, temperature 98.4 F (36.9 C), temperature source Oral, resp. rate 18, height 5\' 9"  (1.753 m), weight 122 kg (269 lb), last menstrual period 09/27/2016, SpO2 100 %, unknown if currently breastfeeding. Resp: clear to auscultation bilaterally Cardio: regular rate and rhythm GI: soft, mild tenderness. incisions look good  Disposition: 01-Home or Self Care  Discharge Instructions    Call MD for:  difficulty breathing, headache or visual disturbances    Complete by:  As directed    Call MD for:  extreme fatigue    Complete by:  As directed    Call MD for:  hives    Complete by:  As directed    Call MD for:  persistant dizziness or light-headedness    Complete by:  As directed    Call MD for:  persistant nausea and vomiting    Complete by:  As directed    Call MD for:  redness, tenderness, or signs of infection (pain, swelling, redness, odor or green/yellow discharge around incision site)    Complete by:  As directed    Call MD for:  severe uncontrolled pain    Complete by:  As directed    Call MD for:  temperature >100.4    Complete by:  As directed    Diet - low sodium heart healthy    Complete by:  As directed    Discharge instructions    Complete by:  As directed    May shower. No heavy lifting. Diet as tolerated. Use colace and miralax to avoid constipation   Increase activity slowly    Complete by:  As directed    No  wound care    Complete by:  As directed        Medication List    TAKE these medications   ibuprofen 200 MG tablet Commonly known as:  ADVIL,MOTRIN Take 600 mg by mouth every 6 (six) hours as needed for headache or mild pain.   ondansetron 4 MG tablet Commonly known as:  ZOFRAN Take 1 tablet (4 mg total) by mouth every 8 (eight) hours as needed for nausea.   oxyCODONE-acetaminophen 5-325 MG tablet Commonly known as:  PERCOCET/ROXICET Take 1-2 tablets by mouth every 6 (six) hours as needed. What changed:  Another medication with the same name was added. Make sure you understand how and when to take each.   oxyCODONE-acetaminophen 5-325 MG tablet Commonly known as:  ROXICET Take 1-2 tablets by mouth every 4 (four) hours as needed. What changed:  You were already taking a medication with the same name, and this prescription was added. Make sure you understand how and when to take each.   promethazine 25 MG tablet Commonly known as:  PHENERGAN Take 1 tablet (25 mg total) by mouth every 6 (six) hours as needed for nausea or vomiting.   Wrist Splint/Cock-Up/Left M Misc Left cockup wrist splint. Wear nightly x 6 weeks. Patient may choose size.      Follow-up Information    TOTH  III,Purnell Daigle S, MD Follow up in 2 week(s).   Specialty:  General Surgery Contact information: [address] [address] [phone]           Signed: Merrie Roof 10/13/2016, 6:36 AM

## 2016-10-13 NOTE — Progress Notes (Signed)
After breakfast patient complaining of sick stomach. zofran given. At lunch , patient tolerated diet but still complaining of pain and patient unable to pass gas. PA made aware.

## 2016-10-14 DIAGNOSIS — K432 Incisional hernia without obstruction or gangrene: Secondary | ICD-10-CM | POA: Diagnosis not present

## 2016-10-14 MED ORDER — KETOROLAC TROMETHAMINE 30 MG/ML IJ SOLN: 30.0000 mg | Freq: Three times a day (TID) | INTRAMUSCULAR | Status: DC

## 2016-10-14 MED ORDER — KETOROLAC TROMETHAMINE 10 MG PO TABS
10.0000 mg | ORAL_TABLET | Freq: Four times a day (QID) | ORAL | Status: DC
  Administered 2016-10-14: 10 mg via ORAL
  Filled 2016-10-14 (×2): qty 1

## 2016-10-14 NOTE — Progress Notes (Signed)
Discharge home. Home discharge instruction given, no question verbalized. 

## 2016-10-14 NOTE — Progress Notes (Signed)
2 Days Post-Op  Subjective: C/o pain. No n/v. +flatus. No burping. No bm.   Objective: Vital signs in last 24 hours: Temp:  [98.5 F (36.9 C)] 98.5 F (36.9 C) (10/20 0519) Pulse Rate:  [66-91] 91 (10/20 0519) Resp:  [18-19] 18 (10/20 0519) BP: (124-125)/(70-80) 125/70 (10/20 0519) SpO2:  [94 %-100 %] 100 % (10/20 0519) Last BM Date: 10/12/16  Intake/Output from previous day: 10/19 0701 - 10/20 0700 In: 720 [P.O.:720] Out: -  Intake/Output this shift: No intake/output data recorded.  Awake, nontoxic cta Reg Obese, soft, incisions c/d/i. Mild approp tenderness  Lab Results:  No results for input(s): WBC, HGB, HCT, PLT in the last 72 hours. BMET No results for input(s): NA, K, CL, CO2, GLUCOSE, BUN, CREATININE, CALCIUM in the last 72 hours. PT/INR No results for input(s): LABPROT, INR in the last 72 hours. ABG No results for input(s): PHART, HCO3 in the last 72 hours.  Invalid input(s): PCO2, PO2  Studies/Results: No results found.  Anti-infectives: Anti-infectives    Start     Dose/Rate Route Frequency Ordered Stop   10/12/16 0800  ceFAZolin (ANCEF) 3 g in dextrose 5 % 50 mL IVPB     3 g 130 mL/hr over 30 Minutes Intravenous To ShortStay Surgical 10/11/16 1422 10/12/16 0854      Assessment/Plan: s/p Procedure(s): LAPAROSCOPIC VENTRAL HERNIA (N/A) INSERTION OF MESH (N/A)  Has bowel function Discussed that LapVHR can be painful with how the mesh is secured.  Will add some toradol Cont chemical vte prophylaxis Will have PA reassess later today Discussed importance of walking  Candace Ramirez. Candace Pulling, MD, FACS General, Bariatric, & Minimally Invasive Surgery [facility], [place]   LOS: 0 days    Candace Ramirez 10/14/2016

## 2017-03-21 ENCOUNTER — Encounter: Payer: Medicaid Other | Admitting: Family Medicine

## 2017-03-22 NOTE — Progress Notes (Signed)
This encounter was created in error - please disregard.

## 2018-04-08 ENCOUNTER — Emergency Department (HOSPITAL_COMMUNITY)
Admission: EM | Admit: 2018-04-08 | Discharge: 2018-04-08 | Disposition: A | Discharge status: Home / Self Care | Payer: Self-pay | Attending: Emergency Medicine | Admitting: Emergency Medicine

## 2018-04-08 ENCOUNTER — Emergency Department (HOSPITAL_COMMUNITY): Payer: Self-pay

## 2018-04-08 ENCOUNTER — Encounter (HOSPITAL_COMMUNITY): Payer: Self-pay | Admitting: Emergency Medicine

## 2018-04-08 DIAGNOSIS — J181 Lobar pneumonia, unspecified organism: Secondary | ICD-10-CM | POA: Insufficient documentation

## 2018-04-08 DIAGNOSIS — F17210 Nicotine dependence, cigarettes, uncomplicated: Secondary | ICD-10-CM | POA: Insufficient documentation

## 2018-04-08 DIAGNOSIS — J189 Pneumonia, unspecified organism: Secondary | ICD-10-CM

## 2018-04-08 MED ORDER — ACETAMINOPHEN 325 MG PO TABS
650.0000 mg | ORAL_TABLET | Freq: Once | ORAL | Status: AC | PRN
  Administered 2018-04-08: 650 mg via ORAL
  Filled 2018-04-08: qty 2

## 2018-04-08 MED ORDER — BENZONATATE 100 MG PO CAPS: 100.0000 mg | ORAL_CAPSULE | Freq: Three times a day (TID) | ORAL | 0 refills | Status: DC | PRN

## 2018-04-08 MED ORDER — DOXYCYCLINE HYCLATE 100 MG PO CAPS: 100.0000 mg | ORAL_CAPSULE | Freq: Two times a day (BID) | ORAL | 0 refills | Status: DC

## 2018-04-08 NOTE — ED Triage Notes (Signed)
Per Pt: Cough x 4 days, pt has taken OTC robitussin once today w/ relief. Pt A&Ox4 appears in no acute distress. Pt ambulatory.

## 2018-04-08 NOTE — Discharge Instructions (Signed)
You were seen in the emergency department today and diagnosed with pneumonia.  We are treating the infection with an antibiotic, doxycycline. Please take all of your antibiotics until finished. You may develop abdominal discomfort or diarrhea from the antibiotic.  You may help offset this with probiotics which you can buy at the store (ask your pharmacist if unable to find) or get probiotics in the form of eating yogurt. Do not eat or take the probiotics until 2 hours after your antibiotic. If you are unable to tolerate these side effects follow-up with your primary care provider or return to the emergency department.   If you begin to experience any blistering, rashes, swelling, or difficulty breathing seek medical care for evaluation of potentially more serious side effects.   Please be aware that this medication may interact with other medications you are taking, please be sure to discuss your medication list with your pharmacist.   Additionally we have given you Tessalon, this is a cough medication, take this every 8 hours as needed for coughing.  Follow-up with your primary care provider in 5-7 days for reevaluation of your symptoms.  Return to the ER anytime for new or worsening symptoms including but not limited to trouble breathing, chest pain, passing out, fever not improved with Motrin/Tylenol, or any other concerns that she may have.

## 2018-04-08 NOTE — ED Notes (Signed)
Ambulated Pt in [place] while on Pulse Ox without O2. Pt stayed between 97%-100% the entire walk. Pt stated she did not get short of breath while walking.
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
## 2018-06-09 ENCOUNTER — Encounter (HOSPITAL_COMMUNITY)
Admission: EM | Disposition: A | Discharge status: Home / Self Care | Payer: Self-pay | Source: Home / Self Care | Attending: Emergency Medicine

## 2018-06-09 ENCOUNTER — Emergency Department (HOSPITAL_COMMUNITY): Payer: No Typology Code available for payment source | Admitting: Certified Registered Nurse Anesthetist

## 2018-06-09 ENCOUNTER — Emergency Department (HOSPITAL_COMMUNITY): Payer: No Typology Code available for payment source

## 2018-06-09 ENCOUNTER — Ambulatory Visit (HOSPITAL_COMMUNITY)
Admission: EM | Admit: 2018-06-09 | Discharge: 2018-06-09 | Disposition: A | Discharge status: Home / Self Care | Payer: No Typology Code available for payment source | Attending: Orthopedic Surgery | Admitting: Orthopedic Surgery

## 2018-06-09 ENCOUNTER — Encounter (HOSPITAL_COMMUNITY): Payer: Self-pay | Admitting: Certified Registered Nurse Anesthetist

## 2018-06-09 DIAGNOSIS — F17210 Nicotine dependence, cigarettes, uncomplicated: Secondary | ICD-10-CM | POA: Diagnosis not present

## 2018-06-09 DIAGNOSIS — M7631 Iliotibial band syndrome, right leg: Secondary | ICD-10-CM | POA: Diagnosis not present

## 2018-06-09 DIAGNOSIS — Z86718 Personal history of other venous thrombosis and embolism: Secondary | ICD-10-CM | POA: Insufficient documentation

## 2018-06-09 DIAGNOSIS — S81011A Laceration without foreign body, right knee, initial encounter: Secondary | ICD-10-CM | POA: Diagnosis not present

## 2018-06-09 DIAGNOSIS — S0993XA Unspecified injury of face, initial encounter: Secondary | ICD-10-CM

## 2018-06-09 DIAGNOSIS — S82141A Displaced bicondylar fracture of right tibia, initial encounter for closed fracture: Secondary | ICD-10-CM | POA: Insufficient documentation

## 2018-06-09 DIAGNOSIS — Y92410 Unspecified street and highway as the place of occurrence of the external cause: Secondary | ICD-10-CM | POA: Insufficient documentation

## 2018-06-09 DIAGNOSIS — M25061 Hemarthrosis, right knee: Secondary | ICD-10-CM | POA: Insufficient documentation

## 2018-06-09 DIAGNOSIS — S83281A Other tear of lateral meniscus, current injury, right knee, initial encounter: Secondary | ICD-10-CM | POA: Diagnosis not present

## 2018-06-09 DIAGNOSIS — Z6841 Body Mass Index (BMI) 40.0 and over, adult: Secondary | ICD-10-CM | POA: Insufficient documentation

## 2018-06-09 DIAGNOSIS — S8990XA Unspecified injury of unspecified lower leg, initial encounter: Secondary | ICD-10-CM

## 2018-06-09 DIAGNOSIS — S86921A Laceration of unspecified muscle(s) and tendon(s) at lower leg level, right leg, initial encounter: Secondary | ICD-10-CM

## 2018-06-09 DIAGNOSIS — F419 Anxiety disorder, unspecified: Secondary | ICD-10-CM | POA: Insufficient documentation

## 2018-06-09 HISTORY — DX: Other tear of lateral meniscus, current injury, right knee, initial encounter: S83.281A

## 2018-06-09 HISTORY — DX: Laceration of unspecified muscle(s) and tendon(s) at lower leg level, right leg, initial encounter: S81.011A

## 2018-06-09 HISTORY — DX: Laceration of unspecified muscle(s) and tendon(s) at lower leg level, right leg, initial encounter: S86.921A

## 2018-06-09 HISTORY — PX: I&D EXTREMITY: SHX5045

## 2018-06-09 HISTORY — DX: Laceration without foreign body, right knee, initial encounter: S81.011A

## 2018-06-09 LAB — BASIC METABOLIC PANEL
ANION GAP: 6 (ref 5–15)
BUN: 5 mg/dL — ABNORMAL LOW (ref 6–20)
CALCIUM: 8.7 mg/dL — AB (ref 8.9–10.3)
CO2: 23 mmol/L (ref 22–32)
Chloride: 111 mmol/L (ref 101–111)
Creatinine, Ser: 1.02 mg/dL — ABNORMAL HIGH (ref 0.44–1.00)
GFR calc non Af Amer: >60 mL/min (ref >60)
Glucose, Bld: 116 mg/dL — ABNORMAL HIGH (ref 65–99)
Potassium: 3.5 mmol/L (ref 3.5–5.1)
Sodium: 140 mmol/L (ref 135–145)

## 2018-06-09 LAB — CBC WITH DIFFERENTIAL/PLATELET
ABS IMMATURE GRANULOCYTES: 0 10*3/uL (ref 0.0–0.1)
Basophils Absolute: 0 10*3/uL (ref 0.0–0.1)
Basophils Relative: 0 %
EOS ABS: 0 10*3/uL (ref 0.0–0.7)
Eosinophils Relative: 1 %
HEMATOCRIT: 30.9 % — AB (ref 36.0–46.0)
HEMOGLOBIN: 8.7 g/dL — AB (ref 12.0–15.0)
IMMATURE GRANULOCYTES: 0 %
LYMPHS ABS: 1.6 10*3/uL (ref 0.7–4.0)
LYMPHS PCT: 22 %
MCH: 21.6 pg — ABNORMAL LOW (ref 26.0–34.0)
MCHC: 28.2 g/dL — ABNORMAL LOW (ref 30.0–36.0)
MCV: 76.9 fL — ABNORMAL LOW (ref 78.0–100.0)
Monocytes Absolute: 0.6 10*3/uL (ref 0.1–1.0)
Monocytes Relative: 9 %
NEUTROS ABS: 4.8 10*3/uL (ref 1.7–7.7)
NEUTROS PCT: 68 %
Platelets: 327 10*3/uL (ref 150–400)
RBC: 4.02 MIL/uL (ref 3.87–5.11)
RDW: 17.8 % — AB (ref 11.5–15.5)
WBC: 7 10*3/uL (ref 4.0–10.5)

## 2018-06-09 LAB — POCT PREGNANCY, URINE: PREG TEST UR: NEGATIVE

## 2018-06-09 SURGERY — IRRIGATION AND DEBRIDEMENT EXTREMITY
Anesthesia: General | Laterality: Right

## 2018-06-09 MED ORDER — SUCCINYLCHOLINE CHLORIDE 20 MG/ML IJ SOLN
INTRAMUSCULAR | Status: DC | PRN
  Administered 2018-06-09: 140 mg via INTRAVENOUS

## 2018-06-09 MED ORDER — SODIUM CHLORIDE 0.9 % IV BOLUS
500.0000 mL | Freq: Once | INTRAVENOUS | Status: AC
  Administered 2018-06-09: 500 mL via INTRAVENOUS

## 2018-06-09 MED ORDER — PROMETHAZINE HCL 25 MG/ML IJ SOLN: 6.2500 mg | INTRAMUSCULAR | Status: DC | PRN

## 2018-06-09 MED ORDER — FENTANYL CITRATE (PF) 100 MCG/2ML IJ SOLN
25.0000 ug | INTRAMUSCULAR | Status: DC | PRN
  Administered 2018-06-09 (×3): 50 ug via INTRAVENOUS

## 2018-06-09 MED ORDER — SUGAMMADEX SODIUM 500 MG/5ML IV SOLN
INTRAVENOUS | Status: DC | PRN
  Administered 2018-06-09: 250 mg via INTRAVENOUS

## 2018-06-09 MED ORDER — HYDROMORPHONE HCL 2 MG/ML IJ SOLN
0.5000 mg | Freq: Once | INTRAMUSCULAR | Status: AC
  Administered 2018-06-09: 0.5 mg via INTRAVENOUS
  Filled 2018-06-09: qty 1

## 2018-06-09 MED ORDER — EPHEDRINE SULFATE 50 MG/ML IJ SOLN
INTRAMUSCULAR | Status: AC
  Filled 2018-06-09: qty 1

## 2018-06-09 MED ORDER — HYDROCODONE-ACETAMINOPHEN 5-325 MG PO TABS: 1.0000 | ORAL_TABLET | Freq: Four times a day (QID) | ORAL | 0 refills | Status: DC | PRN

## 2018-06-09 MED ORDER — PROPOFOL 10 MG/ML IV BOLUS
INTRAVENOUS | Status: DC | PRN
  Administered 2018-06-09: 240 mg via INTRAVENOUS

## 2018-06-09 MED ORDER — ROCURONIUM BROMIDE 100 MG/10ML IV SOLN
INTRAVENOUS | Status: DC | PRN
  Administered 2018-06-09: 30 mg via INTRAVENOUS

## 2018-06-09 MED ORDER — FENTANYL CITRATE (PF) 100 MCG/2ML IJ SOLN
INTRAMUSCULAR | Status: AC
  Filled 2018-06-09: qty 2

## 2018-06-09 MED ORDER — PROPOFOL 10 MG/ML IV BOLUS
INTRAVENOUS | Status: AC
  Filled 2018-06-09: qty 20

## 2018-06-09 MED ORDER — MIDAZOLAM HCL 2 MG/2ML IJ SOLN
INTRAMUSCULAR | Status: AC
  Filled 2018-06-09: qty 2

## 2018-06-09 MED ORDER — LACTATED RINGERS IV SOLN
INTRAVENOUS | Status: DC | PRN
  Administered 2018-06-09 (×2): via INTRAVENOUS

## 2018-06-09 MED ORDER — PHENYLEPHRINE 40 MCG/ML (10ML) SYRINGE FOR IV PUSH (FOR BLOOD PRESSURE SUPPORT)
PREFILLED_SYRINGE | INTRAVENOUS | Status: AC
  Filled 2018-06-09: qty 10

## 2018-06-09 MED ORDER — DEXAMETHASONE SODIUM PHOSPHATE 10 MG/ML IJ SOLN
INTRAMUSCULAR | Status: AC
  Filled 2018-06-09: qty 1

## 2018-06-09 MED ORDER — PHENYLEPHRINE HCL 10 MG/ML IJ SOLN
INTRAMUSCULAR | Status: DC | PRN
  Administered 2018-06-09: 80 ug via INTRAVENOUS
  Administered 2018-06-09: 120 ug via INTRAVENOUS
  Administered 2018-06-09: 80 ug via INTRAVENOUS
  Administered 2018-06-09: 120 ug via INTRAVENOUS
  Administered 2018-06-09: 80 ug via INTRAVENOUS

## 2018-06-09 MED ORDER — LIDOCAINE-EPINEPHRINE (PF) 2 %-1:200000 IJ SOLN
20.0000 mL | Freq: Once | INTRAMUSCULAR | Status: AC
  Administered 2018-06-09: 20 mL
  Filled 2018-06-09: qty 20

## 2018-06-09 MED ORDER — LIDOCAINE HCL (CARDIAC) PF 100 MG/5ML IV SOSY
PREFILLED_SYRINGE | INTRAVENOUS | Status: DC | PRN
  Administered 2018-06-09: 100 mg via INTRAVENOUS

## 2018-06-09 MED ORDER — DEXTROSE 5 % IV SOLN
3.0000 g | Freq: Once | INTRAVENOUS | Status: AC
  Administered 2018-06-09: 3 g via INTRAVENOUS
  Filled 2018-06-09: qty 3

## 2018-06-09 MED ORDER — MIDAZOLAM HCL 5 MG/5ML IJ SOLN
INTRAMUSCULAR | Status: DC | PRN
Start: 2018-06-09 — End: 2018-06-09
  Administered 2018-06-09 (×2): 1 mg via INTRAVENOUS

## 2018-06-09 MED ORDER — FENTANYL CITRATE (PF) 100 MCG/2ML IJ SOLN
INTRAMUSCULAR | Status: DC | PRN
  Administered 2018-06-09: 50 ug via INTRAVENOUS

## 2018-06-09 MED ORDER — CEFAZOLIN SODIUM-DEXTROSE 1-4 GM/50ML-% IV SOLN: 1.0000 g | Freq: Once | INTRAVENOUS | Status: DC

## 2018-06-09 MED ORDER — ONDANSETRON HCL 4 MG PO TABS: 4.0000 mg | ORAL_TABLET | Freq: Three times a day (TID) | ORAL | 0 refills | Status: DC | PRN

## 2018-06-09 MED ORDER — ONDANSETRON HCL 4 MG/2ML IJ SOLN
INTRAMUSCULAR | Status: DC | PRN
  Administered 2018-06-09: 4 mg via INTRAVENOUS

## 2018-06-09 MED ORDER — FENTANYL CITRATE (PF) 250 MCG/5ML IJ SOLN
INTRAMUSCULAR | Status: AC
  Filled 2018-06-09: qty 5

## 2018-06-09 MED ORDER — SODIUM CHLORIDE 0.9 % IR SOLN
Status: DC | PRN
  Administered 2018-06-09 (×3): 3000 mL

## 2018-06-09 MED ORDER — ROCURONIUM BROMIDE 50 MG/5ML IV SOLN
INTRAVENOUS | Status: AC
  Filled 2018-06-09: qty 1

## 2018-06-09 MED ORDER — TETANUS-DIPHTH-ACELL PERTUSSIS 5-2.5-18.5 LF-MCG/0.5 IM SUSP
0.5000 mL | Freq: Once | INTRAMUSCULAR | Status: AC
  Administered 2018-06-09: 0.5 mL via INTRAMUSCULAR
  Filled 2018-06-09: qty 0.5

## 2018-06-09 MED ORDER — CEPHALEXIN 500 MG PO CAPS: 500.0000 mg | ORAL_CAPSULE | Freq: Four times a day (QID) | ORAL | 0 refills | Status: DC

## 2018-06-09 MED ORDER — ONDANSETRON HCL 4 MG/2ML IJ SOLN
INTRAMUSCULAR | Status: AC
  Filled 2018-06-09: qty 2

## 2018-06-09 MED ORDER — ONDANSETRON HCL 4 MG/2ML IJ SOLN
4.0000 mg | Freq: Once | INTRAMUSCULAR | Status: AC
  Administered 2018-06-09: 4 mg via INTRAVENOUS
  Filled 2018-06-09: qty 2

## 2018-06-09 MED ORDER — DEXAMETHASONE SODIUM PHOSPHATE 10 MG/ML IJ SOLN
INTRAMUSCULAR | Status: DC | PRN
  Administered 2018-06-09: 10 mg via INTRAVENOUS

## 2018-06-09 SURGICAL SUPPLY — 42 items
BANDAGE ELASTIC 4 VELCRO ST LF (GAUZE/BANDAGES/DRESSINGS) ×2 IMPLANT
CANNULA 5.75X7 CRYSTAL CLEAR (CANNULA) ×2 IMPLANT
COVER SURGICAL LIGHT HANDLE (MISCELLANEOUS) ×3 IMPLANT
GAUZE SPONGE 4X4 12PLY STRL LF (GAUZE/BANDAGES/DRESSINGS) ×2 IMPLANT
GAUZE XEROFORM 5X9 LF (GAUZE/BANDAGES/DRESSINGS) ×2 IMPLANT
GLOVE BIOGEL PI ORTHO PRO SZ8 (GLOVE) ×2
GLOVE ORTHO TXT STRL SZ7.5 (GLOVE) ×7 IMPLANT
GLOVE PI ORTHO PRO STRL SZ8 (GLOVE) ×1 IMPLANT
GLOVE SURG ORTHO 8.0 STRL STRW (GLOVE) ×6 IMPLANT
GOWN STRL REUS W/ TWL LRG LVL3 (GOWN DISPOSABLE) IMPLANT
GOWN STRL REUS W/ TWL XL LVL3 (GOWN DISPOSABLE) IMPLANT
GOWN STRL REUS W/TWL LRG LVL3 (GOWN DISPOSABLE) ×6
GOWN STRL REUS W/TWL XL LVL3 (GOWN DISPOSABLE) ×6
HANDPIECE INTERPULSE COAX TIP (DISPOSABLE) ×3
IMMOBILIZER KNEE 22  40 CIR (ORTHOPEDIC SUPPLIES) ×2
IMMOBILIZER KNEE 22 40 CIR (ORTHOPEDIC SUPPLIES) IMPLANT
KIT BASIN OR (CUSTOM PROCEDURE TRAY) ×3 IMPLANT
KIT TURNOVER KIT B (KITS) ×3 IMPLANT
MANIFOLD NEPTUNE II (INSTRUMENTS) ×3 IMPLANT
NDL 18GX1X1/2 (RX/OR ONLY) (NEEDLE) IMPLANT
NEEDLE 18GX1X1/2 (RX/OR ONLY) (NEEDLE) ×3 IMPLANT
NS IRRIG 1000ML POUR BTL (IV SOLUTION) ×3 IMPLANT
PACK ORTHO EXTREMITY (CUSTOM PROCEDURE TRAY) ×3 IMPLANT
PAD ABD 8X10 STRL (GAUZE/BANDAGES/DRESSINGS) ×2 IMPLANT
PAD ARMBOARD 7.5X6 YLW CONV (MISCELLANEOUS) ×6 IMPLANT
SET HNDPC FAN SPRY TIP SCT (DISPOSABLE) IMPLANT
SET IRRIG Y TYPE TUR BLADDER L (SET/KITS/TRAYS/PACK) ×2 IMPLANT
SPONGE LAP 18X18 X RAY DECT (DISPOSABLE) ×5 IMPLANT
STOCKINETTE IMPERVIOUS 9X36 MD (GAUZE/BANDAGES/DRESSINGS) ×3 IMPLANT
SUCTION FRAZIER TIP 8 FR DISP (SUCTIONS) ×2
SUCTION TUBE FRAZIER 8FR DISP (SUCTIONS) IMPLANT
SUT ETHILON 3 0 PS 1 (SUTURE) ×2 IMPLANT
SUT PROLENE 0 CT 1 30 (SUTURE) ×2 IMPLANT
SUT PROLENE 0 SH 30 (SUTURE) ×6 IMPLANT
SYR 20ML ECCENTRIC (SYRINGE) ×2 IMPLANT
TOWEL OR 17X24 6PK STRL BLUE (TOWEL DISPOSABLE) ×3 IMPLANT
TOWEL OR 17X26 10 PK STRL BLUE (TOWEL DISPOSABLE) ×3 IMPLANT
TUBE CONNECTING 12'X1/4 (SUCTIONS) ×1
TUBE CONNECTING 12X1/4 (SUCTIONS) ×2 IMPLANT
UNDERPAD 30X30 (UNDERPADS AND DIAPERS) ×5 IMPLANT
WATER STERILE IRR 1000ML POUR (IV SOLUTION) ×3 IMPLANT
YANKAUER SUCT BULB TIP NO VENT (SUCTIONS) ×3 IMPLANT
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
## 2018-06-09 NOTE — Anesthesia Preprocedure Evaluation (Signed)
Anesthesia Evaluation  Patient identified by MRN, date of birth, ID band Patient awake    Reviewed: Allergy & Precautions, NPO status , Patient's Chart, lab work & pertinent test results  Airway Mallampati: II  TM Distance: >3 FB Neck ROM: Full    Dental  (+) Dental Advisory Given   Pulmonary Current Smoker,    Pulmonary exam normal        Cardiovascular negative cardio ROS   Rhythm:Regular Rate:Normal     Neuro/Psych Anxiety negative neurological ROS     GI/Hepatic Neg liver ROS, hiatal hernia,   Endo/Other  Morbid obesity  Renal/GU negative Renal ROS     Musculoskeletal   Abdominal   Peds  Hematology  (+) anemia ,   Anesthesia Other Findings   Reproductive/Obstetrics                             Lab Results  Component Value Date   WBC 7.0 06/09/2018   HGB 8.7 (L) 06/09/2018   HCT 30.9 (L) 06/09/2018   MCV 76.9 (L) 06/09/2018   PLT 327 06/09/2018   Lab Results  Component Value Date   CREATININE 1.02 (H) 06/09/2018   BUN 5 (L) 06/09/2018   NA 140 06/09/2018   K 3.5 06/09/2018   CL 111 06/09/2018   CO2 23 06/09/2018    Anesthesia Physical Anesthesia Plan  ASA: III and emergent  Anesthesia Plan: General   Post-op Pain Management:    Induction: Intravenous and Rapid sequence  PONV Risk Score and Plan: 2 and Ondansetron, Dexamethasone and Treatment may vary due to age or medical condition  Airway Management Planned: Oral ETT  Additional Equipment:   Intra-op Plan:   Post-operative Plan: Extubation in OR  Informed Consent: I have reviewed the patients History and Physical, chart, labs and discussed the procedure including the risks, benefits and alternatives for the proposed anesthesia with the patient or authorized representative who has indicated his/her understanding and acceptance.   Dental advisory given  Plan Discussed with: CRNA  Anesthesia Plan  Comments:         Anesthesia Quick Evaluation

## 2018-06-09 NOTE — Anesthesia Procedure Notes (Signed)
Procedure Name: Intubation Date/Time: 06/09/2018 6:02 PM Performed by: Inda Coke, CRNA Pre-anesthesia Checklist: Patient identified, Emergency Drugs available, Suction available and Patient being monitored Patient Re-evaluated:Patient Re-evaluated prior to induction Oxygen Delivery Method: Circle System Utilized Preoxygenation: Pre-oxygenation with 100% oxygen Induction Type: IV induction Ventilation: Mask ventilation without difficulty Laryngoscope Size: Mac and 4 Grade View: Grade I Tube type: Oral Tube size: 7.0 mm Number of attempts: 1 Airway Equipment and Method: Stylet and Oral airway Placement Confirmation: ETT inserted through vocal cords under direct vision,  positive ETCO2 and breath sounds checked- equal and bilateral Secured at: 22 cm Tube secured with: Tape Dental Injury: Teeth and Oropharynx as per pre-operative assessment

## 2018-06-09 NOTE — Transfer of Care (Signed)
Immediate Anesthesia Transfer of Care Note  Patient: Aneisha Skyles Mackintosh  Procedure(s) Performed: IRRIGATION AND DEBRIDEMENT RIGHT KNEE WITH LATERAL MENISCUS REPAIR AND COMPLEX 12CM. LACERATION OF I.Q. BAND REPAIR. (Right )  Patient Location: PACU  Anesthesia Type:General  Level of Consciousness: sedated  Airway & Oxygen Therapy: Patient connected to nasal cannula oxygen  Post-op Assessment: Report given to RN and Post -op Vital signs reviewed and stable  Post vital signs: Reviewed and stable  Last Vitals:  Vitals Value Taken Time  BP    Temp    Pulse    Resp    SpO2      Last Pain:  Vitals:   06/09/18 1338  TempSrc:   PainSc: 10-Worst pain ever         Complications: No apparent anesthesia complications
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
## 2018-06-09 NOTE — Anesthesia Postprocedure Evaluation (Signed)
Anesthesia Post Note  Patient: Taryne Kiger Tsuda  Procedure(s) Performed: IRRIGATION AND DEBRIDEMENT RIGHT KNEE WITH LATERAL MENISCUS REPAIR AND COMPLEX 12CM. LACERATION OF I.Q. BAND REPAIR. (Right )     Patient location during evaluation: PACU Anesthesia Type: General Level of consciousness: awake and alert Pain management: pain level controlled Vital Signs Assessment: post-procedure vital signs reviewed and stable Respiratory status: spontaneous breathing, nonlabored ventilation, respiratory function stable and patient connected to nasal cannula oxygen Cardiovascular status: blood pressure returned to baseline and stable Postop Assessment: no apparent nausea or vomiting Anesthetic complications: no    Last Vitals:  Vitals:   06/09/18 2100 06/09/18 2115  BP: 121/87 (!) 123/95  Pulse: 89 79  Resp: 14 14  Temp:  36.8 C  SpO2: 93% 95%    Last Pain:  Vitals:   06/09/18 2115  TempSrc:   PainSc: 4                  Tiajuana Amass
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
## 2018-06-10 ENCOUNTER — Encounter (HOSPITAL_COMMUNITY): Payer: Self-pay | Admitting: Orthopedic Surgery

## 2018-06-21 ENCOUNTER — Ambulatory Visit: Payer: Self-pay | Admitting: Family Medicine

## 2018-06-23 ENCOUNTER — Ambulatory Visit (HOSPITAL_COMMUNITY)
Admission: EM | Admit: 2018-06-23 | Discharge: 2018-06-23 | Disposition: A | Discharge status: Home / Self Care | Payer: Self-pay | Attending: Family Medicine | Admitting: Family Medicine

## 2018-06-23 ENCOUNTER — Other Ambulatory Visit: Payer: Self-pay

## 2018-06-23 ENCOUNTER — Encounter (HOSPITAL_COMMUNITY): Payer: Self-pay | Admitting: *Deleted

## 2018-06-23 ENCOUNTER — Ambulatory Visit (INDEPENDENT_AMBULATORY_CARE_PROVIDER_SITE_OTHER): Payer: Self-pay

## 2018-06-23 DIAGNOSIS — M25562 Pain in left knee: Secondary | ICD-10-CM

## 2018-06-23 DIAGNOSIS — S8002XA Contusion of left knee, initial encounter: Secondary | ICD-10-CM
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
## 2018-06-23 NOTE — Discharge Instructions (Addendum)
Looks like you might have hematoma around your knee. Xray looks fine. For hematoma <48 hours can be drained with syring, but over 48 hours, it will need to be opened. This can be done in the ED. Otherwise see PCP in 2 days for reassessment and if necessary to schedule an US of the swelling.  F/U orthopedic as planned. Use Ibuprofen as needed for pain.
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
## 2018-06-26 ENCOUNTER — Emergency Department (HOSPITAL_COMMUNITY)
Admission: EM | Admit: 2018-06-26 | Discharge: 2018-06-26 | Disposition: A | Discharge status: Home / Self Care | Payer: No Typology Code available for payment source | Attending: Emergency Medicine | Admitting: Emergency Medicine

## 2018-06-26 ENCOUNTER — Other Ambulatory Visit: Payer: Self-pay

## 2018-06-26 ENCOUNTER — Encounter (HOSPITAL_COMMUNITY): Payer: Self-pay | Admitting: Emergency Medicine

## 2018-06-26 DIAGNOSIS — T148XXA Other injury of unspecified body region, initial encounter: Secondary | ICD-10-CM

## 2018-06-26 DIAGNOSIS — Y929 Unspecified place or not applicable: Secondary | ICD-10-CM | POA: Diagnosis not present

## 2018-06-26 DIAGNOSIS — Y999 Unspecified external cause status: Secondary | ICD-10-CM | POA: Insufficient documentation

## 2018-06-26 DIAGNOSIS — S8012XA Contusion of left lower leg, initial encounter: Secondary | ICD-10-CM | POA: Diagnosis not present

## 2018-06-26 DIAGNOSIS — Z79899 Other long term (current) drug therapy: Secondary | ICD-10-CM | POA: Insufficient documentation

## 2018-06-26 DIAGNOSIS — Y939 Activity, unspecified: Secondary | ICD-10-CM | POA: Insufficient documentation

## 2018-06-26 DIAGNOSIS — F17210 Nicotine dependence, cigarettes, uncomplicated: Secondary | ICD-10-CM | POA: Diagnosis not present

## 2018-06-26 DIAGNOSIS — S8992XA Unspecified injury of left lower leg, initial encounter: Secondary | ICD-10-CM | POA: Diagnosis present

## 2018-06-26 MED ORDER — DIPHENHYDRAMINE HCL 25 MG PO TABS: 25.0000 mg | ORAL_TABLET | Freq: Four times a day (QID) | ORAL | 0 refills | Status: DC
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
## 2018-06-26 NOTE — Discharge Instructions (Addendum)
Please read attached information. If you experience any new or worsening signs or symptoms please return to the emergency room for evaluation. Please follow-up with your primary care provider or specialist as discussed. Please use medication prescribed only as directed and discontinue taking if you have any concerning signs or symptoms.   °

## 2018-07-03 ENCOUNTER — Other Ambulatory Visit: Payer: Self-pay

## 2018-07-03 ENCOUNTER — Encounter: Payer: Self-pay | Admitting: Family Medicine

## 2018-07-03 ENCOUNTER — Ambulatory Visit (INDEPENDENT_AMBULATORY_CARE_PROVIDER_SITE_OTHER): Payer: Self-pay | Admitting: Family Medicine

## 2018-07-03 DIAGNOSIS — S81011A Laceration without foreign body, right knee, initial encounter: Secondary | ICD-10-CM

## 2018-07-03 DIAGNOSIS — S86921A Laceration of unspecified muscle(s) and tendon(s) at lower leg level, right leg, initial encounter: Secondary | ICD-10-CM

## 2018-07-03 NOTE — Patient Instructions (Addendum)
It was nice meeting you today.  You were seen in clinic for follow-up after your visit to the ED.  I examined your incision from your recent surgery and this appears to be healing well.  Additionally, your left knee hematoma also seems to be improving.  We will continue to monitor this to make sure it resorbs.  For pain control, you can take ibuprofen 600 mg twice daily.  I would advise you to follow-up with Dr. Mardelle Matte if you have any worsening of your right knee postop.  I hope you feel better soon!  Please call clinic if you have any questions.  Be well,  Lovenia Kim MD
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
## 2018-07-12 NOTE — Assessment & Plan Note (Addendum)
s/p surgery 6/15 by Dr. Mardelle Matte.  Following up today with mild incisional pain, wound c/d/i, otherwise appears to be healing well without sign of infection.   -Continue pain control with Norco and Ibuprofen -return precautions discussed

## 2019-01-01 ENCOUNTER — Inpatient Hospital Stay (HOSPITAL_COMMUNITY)
Admission: EM | Admit: 2019-01-01 | Discharge: 2019-01-04 | DRG: 193 | Disposition: A | Discharge status: Home / Self Care | Payer: Self-pay | Attending: Family Medicine | Admitting: Family Medicine

## 2019-01-01 ENCOUNTER — Emergency Department (HOSPITAL_COMMUNITY): Payer: Self-pay

## 2019-01-01 ENCOUNTER — Other Ambulatory Visit: Payer: Self-pay

## 2019-01-01 ENCOUNTER — Encounter (HOSPITAL_COMMUNITY): Payer: Self-pay | Admitting: Emergency Medicine

## 2019-01-01 DIAGNOSIS — J9601 Acute respiratory failure with hypoxia: Secondary | ICD-10-CM | POA: Diagnosis present

## 2019-01-01 DIAGNOSIS — R062 Wheezing: Secondary | ICD-10-CM

## 2019-01-01 DIAGNOSIS — F17210 Nicotine dependence, cigarettes, uncomplicated: Secondary | ICD-10-CM | POA: Diagnosis present

## 2019-01-01 DIAGNOSIS — J45901 Unspecified asthma with (acute) exacerbation: Secondary | ICD-10-CM | POA: Diagnosis present

## 2019-01-01 DIAGNOSIS — Z720 Tobacco use: Secondary | ICD-10-CM

## 2019-01-01 DIAGNOSIS — D509 Iron deficiency anemia, unspecified: Secondary | ICD-10-CM | POA: Diagnosis present

## 2019-01-01 DIAGNOSIS — E669 Obesity, unspecified: Secondary | ICD-10-CM | POA: Diagnosis present

## 2019-01-01 DIAGNOSIS — Z86718 Personal history of other venous thrombosis and embolism: Secondary | ICD-10-CM

## 2019-01-01 DIAGNOSIS — R0602 Shortness of breath: Secondary | ICD-10-CM

## 2019-01-01 DIAGNOSIS — R0902 Hypoxemia: Secondary | ICD-10-CM | POA: Diagnosis present

## 2019-01-01 DIAGNOSIS — D649 Anemia, unspecified: Secondary | ICD-10-CM

## 2019-01-01 DIAGNOSIS — Z5329 Procedure and treatment not carried out because of patient's decision for other reasons: Secondary | ICD-10-CM | POA: Diagnosis not present

## 2019-01-01 DIAGNOSIS — Z86711 Personal history of pulmonary embolism: Secondary | ICD-10-CM

## 2019-01-01 DIAGNOSIS — J181 Lobar pneumonia, unspecified organism: Secondary | ICD-10-CM

## 2019-01-01 DIAGNOSIS — Z8742 Personal history of other diseases of the female genital tract: Secondary | ICD-10-CM

## 2019-01-01 DIAGNOSIS — J189 Pneumonia, unspecified organism: Principal | ICD-10-CM

## 2019-01-01 DIAGNOSIS — Z6841 Body Mass Index (BMI) 40.0 and over, adult: Secondary | ICD-10-CM

## 2019-01-01 DIAGNOSIS — E876 Hypokalemia: Secondary | ICD-10-CM

## 2019-01-01 DIAGNOSIS — N179 Acute kidney failure, unspecified: Secondary | ICD-10-CM | POA: Diagnosis present

## 2019-01-01 NOTE — ED Triage Notes (Signed)
Pt c/o shortness of breath and cough x 1 day. Denies fevers at home.

## 2019-01-02 ENCOUNTER — Encounter (HOSPITAL_COMMUNITY): Payer: Self-pay | Admitting: Family Medicine

## 2019-01-02 ENCOUNTER — Other Ambulatory Visit: Payer: Self-pay

## 2019-01-02 ENCOUNTER — Emergency Department (HOSPITAL_COMMUNITY): Payer: Self-pay

## 2019-01-02 DIAGNOSIS — E876 Hypokalemia: Secondary | ICD-10-CM

## 2019-01-02 DIAGNOSIS — J9601 Acute respiratory failure with hypoxia: Secondary | ICD-10-CM

## 2019-01-02 LAB — I-STAT ARTERIAL BLOOD GAS, ED
ACID-BASE DEFICIT: 5 mmol/L — AB (ref 0.0–2.0)
Acid-base deficit: 7 mmol/L — ABNORMAL HIGH (ref 0.0–2.0)
Bicarbonate: 20 mmol/L (ref 20.0–28.0)
Bicarbonate: 18.4 mmol/L — ABNORMAL LOW (ref 20.0–28.0)
O2 SAT: 95 %
O2 SAT: 97 %
PCO2 ART: 34.5 mmHg (ref 32.0–48.0)
PH ART: 7.381 (ref 7.350–7.450)
PO2 ART: 75 mmHg — AB (ref 83.0–108.0)
Patient temperature: 98.9
TCO2: 21 mmol/L — ABNORMAL LOW (ref 22–32)
TCO2: 19 mmol/L — ABNORMAL LOW (ref 22–32)
pCO2 arterial: 33.6 mmHg (ref 32.0–48.0)
pH, Arterial: 7.337 — ABNORMAL LOW (ref 7.350–7.450)
pO2, Arterial: 91 mmHg (ref 83.0–108.0)

## 2019-01-02 LAB — CBC WITH DIFFERENTIAL/PLATELET
ABS IMMATURE GRANULOCYTES: 0.02 10*3/uL (ref 0.00–0.07)
BASOS ABS: 0 10*3/uL (ref 0.0–0.1)
BASOS PCT: 0 %
Eosinophils Absolute: 0.1 10*3/uL (ref 0.0–0.5)
Eosinophils Relative: 1 %
HCT: 32.3 % — ABNORMAL LOW (ref 36.0–46.0)
Hemoglobin: 8.6 g/dL — ABNORMAL LOW (ref 12.0–15.0)
IMMATURE GRANULOCYTES: 0 %
Lymphocytes Relative: 16 %
Lymphs Abs: 1.3 10*3/uL (ref 0.7–4.0)
MCH: 20.3 pg — ABNORMAL LOW (ref 26.0–34.0)
MCHC: 26.6 g/dL — ABNORMAL LOW (ref 30.0–36.0)
MCV: 76.4 fL — AB (ref 80.0–100.0)
MONOS PCT: 7 %
Monocytes Absolute: 0.5 10*3/uL (ref 0.1–1.0)
NEUTROS ABS: 6.1 10*3/uL (ref 1.7–7.7)
NEUTROS PCT: 76 %
NRBC: 0 % (ref 0.0–0.2)
PLATELETS: 375 10*3/uL (ref 150–400)
RBC: 4.23 MIL/uL (ref 3.87–5.11)
RDW: 19 % — AB (ref 11.5–15.5)
WBC: 8 10*3/uL (ref 4.0–10.5)

## 2019-01-02 LAB — PROCALCITONIN: Procalcitonin: 0.23 ng/mL

## 2019-01-02 LAB — BASIC METABOLIC PANEL
ANION GAP: 8 (ref 5–15)
Anion gap: 11 (ref 5–15)
BUN: 5 mg/dL — ABNORMAL LOW (ref 6–20)
BUN: <5 mg/dL — ABNORMAL LOW (ref 6–20)
CALCIUM: 8.7 mg/dL — AB (ref 8.9–10.3)
CHLORIDE: 108 mmol/L (ref 98–111)
CO2: 23 mmol/L (ref 22–32)
CO2: 19 mmol/L — ABNORMAL LOW (ref 22–32)
CREATININE: 0.95 mg/dL (ref 0.44–1.00)
Calcium: 9.1 mg/dL (ref 8.9–10.3)
Chloride: 109 mmol/L (ref 98–111)
Creatinine, Ser: 0.84 mg/dL (ref 0.44–1.00)
GFR calc Af Amer: >60 mL/min (ref >60)
GFR calc non Af Amer: >60 mL/min (ref >60)
Glucose, Bld: 95 mg/dL (ref 70–99)
Glucose, Bld: 175 mg/dL — ABNORMAL HIGH (ref 70–99)
POTASSIUM: 3.2 mmol/L — AB (ref 3.5–5.1)
Potassium: 3.4 mmol/L — ABNORMAL LOW (ref 3.5–5.1)
SODIUM: 140 mmol/L (ref 135–145)
Sodium: 138 mmol/L (ref 135–145)

## 2019-01-02 LAB — CBG MONITORING, ED: Glucose-Capillary: 210 mg/dL — ABNORMAL HIGH (ref 70–99)

## 2019-01-02 LAB — MAGNESIUM: Magnesium: 2 mg/dL (ref 1.7–2.4)

## 2019-01-02 LAB — FERRITIN: Ferritin: 5 ng/mL — ABNORMAL LOW (ref 11–307)

## 2019-01-02 LAB — IRON AND TIBC
Iron: 15 ug/dL — ABNORMAL LOW (ref 28–170)
Saturation Ratios: 4 % — ABNORMAL LOW (ref 10.4–31.8)
TIBC: 405 ug/dL (ref 250–450)
UIBC: 390 ug/dL

## 2019-01-02 LAB — STREP PNEUMONIAE URINARY ANTIGEN: Strep Pneumo Urinary Antigen: NEGATIVE

## 2019-01-02 LAB — INFLUENZA PANEL BY PCR (TYPE A & B)
INFLAPCR: NEGATIVE
INFLBPCR: NEGATIVE

## 2019-01-02 LAB — HIV ANTIBODY (ROUTINE TESTING W REFLEX): HIV Screen 4th Generation wRfx: NONREACTIVE

## 2019-01-02 MED ORDER — ALPRAZOLAM 0.25 MG PO TABS: 0.2500 mg | ORAL_TABLET | Freq: Two times a day (BID) | ORAL | Status: DC | PRN

## 2019-01-02 MED ORDER — IPRATROPIUM-ALBUTEROL 0.5-2.5 (3) MG/3ML IN SOLN
5.0000 mL | Freq: Once | RESPIRATORY_TRACT | Status: AC
  Administered 2019-01-02: 5 mL via RESPIRATORY_TRACT
  Filled 2019-01-02: qty 6

## 2019-01-02 MED ORDER — IPRATROPIUM-ALBUTEROL 0.5-2.5 (3) MG/3ML IN SOLN
5.0000 mL | Freq: Once | RESPIRATORY_TRACT | Status: AC
  Administered 2019-01-02: 5 mL via RESPIRATORY_TRACT
  Filled 2019-01-02: qty 6
  Filled 2019-01-02: qty 3

## 2019-01-02 MED ORDER — ENOXAPARIN SODIUM 40 MG/0.4ML ~~LOC~~ SOLN
40.0000 mg | Freq: Every day | SUBCUTANEOUS | Status: DC
  Administered 2019-01-02 – 2019-01-04 (×3): 40 mg via SUBCUTANEOUS
  Filled 2019-01-02 (×4): qty 0.4

## 2019-01-02 MED ORDER — MAGNESIUM SULFATE 2 GM/50ML IV SOLN
2.0000 g | INTRAVENOUS | Status: AC
Start: 2019-01-02 — End: 2019-01-02
  Administered 2019-01-02: 2 g via INTRAVENOUS
  Filled 2019-01-02: qty 50

## 2019-01-02 MED ORDER — INFLUENZA VAC SPLIT QUAD 0.5 ML IM SUSY
0.5000 mL | PREFILLED_SYRINGE | INTRAMUSCULAR | Status: DC
  Filled 2019-01-02: qty 0.5

## 2019-01-02 MED ORDER — BENZONATATE 100 MG PO CAPS
100.0000 mg | ORAL_CAPSULE | Freq: Three times a day (TID) | ORAL | Status: DC | PRN
  Administered 2019-01-02 – 2019-01-04 (×3): 100 mg via ORAL
  Filled 2019-01-02 (×3): qty 1

## 2019-01-02 MED ORDER — POTASSIUM CHLORIDE CRYS ER 20 MEQ PO TBCR
40.0000 meq | EXTENDED_RELEASE_TABLET | Freq: Once | ORAL | Status: AC
  Administered 2019-01-02: 40 meq via ORAL
  Filled 2019-01-02: qty 2

## 2019-01-02 MED ORDER — METHYLPREDNISOLONE SODIUM SUCC 125 MG IJ SOLR
125.0000 mg | Freq: Once | INTRAMUSCULAR | Status: AC
  Administered 2019-01-02: 125 mg via INTRAVENOUS
  Filled 2019-01-02: qty 2

## 2019-01-02 MED ORDER — SODIUM CHLORIDE 0.9 % IV SOLN
500.0000 mg | Freq: Once | INTRAVENOUS | Status: AC
  Administered 2019-01-02: 500 mg via INTRAVENOUS
  Filled 2019-01-02: qty 500

## 2019-01-02 MED ORDER — SODIUM CHLORIDE 0.9 % IV SOLN
1.0000 g | INTRAVENOUS | Status: DC
  Administered 2019-01-03 – 2019-01-04 (×2): 1 g via INTRAVENOUS
  Filled 2019-01-02 (×2): qty 10

## 2019-01-02 MED ORDER — SODIUM CHLORIDE 0.9 % IV SOLN
1.0000 g | Freq: Once | INTRAVENOUS | Status: AC
  Administered 2019-01-02: 1 g via INTRAVENOUS
  Filled 2019-01-02: qty 10

## 2019-01-02 MED ORDER — PNEUMOCOCCAL VAC POLYVALENT 25 MCG/0.5ML IJ INJ
0.5000 mL | INJECTION | INTRAMUSCULAR | Status: DC
  Filled 2019-01-02: qty 0.5

## 2019-01-02 MED ORDER — IPRATROPIUM BROMIDE 0.02 % IN SOLN
0.5000 mg | Freq: Four times a day (QID) | RESPIRATORY_TRACT | Status: DC
  Administered 2019-01-02 – 2019-01-03 (×3): 0.5 mg via RESPIRATORY_TRACT
  Filled 2019-01-02 (×5): qty 2.5

## 2019-01-02 MED ORDER — ALUM & MAG HYDROXIDE-SIMETH 200-200-20 MG/5ML PO SUSP
15.0000 mL | Freq: Four times a day (QID) | ORAL | Status: DC | PRN
  Administered 2019-01-02: 15 mL via ORAL
  Filled 2019-01-02: qty 30

## 2019-01-02 MED ORDER — ACETAMINOPHEN 325 MG PO TABS
650.0000 mg | ORAL_TABLET | Freq: Four times a day (QID) | ORAL | Status: DC | PRN
  Administered 2019-01-02: 650 mg via ORAL
  Filled 2019-01-02: qty 2

## 2019-01-02 MED ORDER — ALBUTEROL (5 MG/ML) CONTINUOUS INHALATION SOLN
10.0000 mg/h | INHALATION_SOLUTION | RESPIRATORY_TRACT | Status: DC
  Administered 2019-01-02: 10 mg/h via RESPIRATORY_TRACT
  Filled 2019-01-02: qty 20

## 2019-01-02 MED ORDER — FAMOTIDINE 20 MG PO TABS
20.0000 mg | ORAL_TABLET | Freq: Two times a day (BID) | ORAL | Status: DC | PRN
  Administered 2019-01-02 – 2019-01-03 (×2): 20 mg via ORAL
  Filled 2019-01-02 (×2): qty 1

## 2019-01-02 MED ORDER — METHYLPREDNISOLONE SODIUM SUCC 125 MG IJ SOLR
60.0000 mg | Freq: Three times a day (TID) | INTRAMUSCULAR | Status: DC
  Administered 2019-01-02 – 2019-01-03 (×4): 60 mg via INTRAVENOUS
  Filled 2019-01-02 (×4): qty 2

## 2019-01-02 MED ORDER — IOPAMIDOL (ISOVUE-370) INJECTION 76%
75.0000 mL | Freq: Once | INTRAVENOUS | Status: AC | PRN
  Administered 2019-01-02: 03:00:00 via INTRAVENOUS

## 2019-01-02 MED ORDER — SODIUM CHLORIDE 0.9 % IV SOLN
500.0000 mg | INTRAVENOUS | Status: DC
  Administered 2019-01-03: 500 mg via INTRAVENOUS
  Filled 2019-01-02: qty 500

## 2019-01-02 MED ORDER — LEVALBUTEROL HCL 0.63 MG/3ML IN NEBU
0.6300 mg | INHALATION_SOLUTION | Freq: Four times a day (QID) | RESPIRATORY_TRACT | Status: DC
  Administered 2019-01-02 – 2019-01-03 (×3): 0.63 mg via RESPIRATORY_TRACT
  Filled 2019-01-02 (×5): qty 3

## 2019-01-02 MED ORDER — NICOTINE 14 MG/24HR TD PT24
14.0000 mg | MEDICATED_PATCH | Freq: Every day | TRANSDERMAL | Status: DC
  Administered 2019-01-02 – 2019-01-04 (×3): 14 mg via TRANSDERMAL
  Filled 2019-01-02 (×3): qty 1

## 2019-01-02 NOTE — Progress Notes (Signed)
PROGRESS NOTE  Brief Narrative: Candace Ramirez is a 43 y.o. 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 Colleagues at work have recently been sick with the flu and pneumonia. She tried nyquil without much improvement and knew she was in trouble when she could no longer smoke due to dyspnea. On arrival to the ED, she was found to be hypoxic, temp 99.3F with wheezing on exam which was improved with nebulizer treatments, though these caused a suffocating sensation limiting adherence. WBC 8k, microcytic anemia with hgb 8.6 (near baseline), CXR revealed chronic bronchitic changes and ECG showed sinus tachycardia without ischemic features. Flu negative. CTA chest was limited due to bolus timing and obesity but showed no large vessel PE. There was central bronchial thickening and patchy opacities bilaterally suspicious for pneumonia. Ceftriaxone and azithromycin were started, supplemental oxygen continued.   Subjective: Feels better than a few hours ago, but still with severe cough and dyspnea. Cough associated with pain across the upper chest, but this is not noticed with exertion or at other times. No palpitations, leg swelling, orthopnea. Denies current vaginal bleeding.  Objective: BP 116/62   Pulse (!) 116   Temp 99.2 F (37.3 C) (Oral)   Resp (!) 23   Ht 5\' 5"  (1.651 m)   Wt 112.5 kg   LMP 12/31/2018   SpO2 95%   BMI 41.27 kg/m   Gen: Obese, pleasant female in no distress Pulm: Tachypneic without accessory muscle use with supplemental oxygen. Took off oxygen during interview and SpO2 dropped into mid-high 80%'s. Diffusely coarse.  CV: Regular tachycardia, no murmur, no JVD, no edema GI: Soft, NT, ND, +BS  Neuro: Alert and oriented. No focal  deficits. Skin: No rashes, lesions or ulcers  Assessment & Plan: Principal Problem:   Acute respiratory failure with hypoxia (HCC) Active Problems:   Chronic anemia   Tobacco use   Hypokalemia  Acute hypoxic respiratory failure: Presumed due to pneumonia and strongly suspect tobacco-related obstructive lung disease. Subsegmental PE not entirely ruled out due to technical difficulties with CTA in pt w/hx DVT but no recent RFs for VTE and much more plausible explanation, so will not pursue further at this time.  - Continue supplemental oxygen to maintain SpO2 >90%. - Treat multifocal pneumonia with ceftriaxone and azithromycin (no recent healthcare exposures). Check strep urine Ag. Check sputum culture. No blood cultures drawn at admission as pt is not septic. Will not draw now that she's had abx. - Check and trend PCT  - Continue scheduled and prn bronchodilators, IV steroids, antitussives. s/p Mag. Will offer prn low dose xanax to improve tolerance of nebs.  - Tobacco cessation counseling provided. Recommend ongoing PCP discussions.  Iron deficiency anemia:  - Hgb near baseline, but would benefit from iron supplementation later in hospital course.  Disposition: - Continues to be appropriate for progressive care unit disposition from ED. - Care discussed with [facility] teaching service, as the patient is regularly seen at [facility], PCP is Dr. Reesa Chew. They will assume care 01/03/2019.  Patrecia Pour, MD 01/02/2019, 9:06 AM

## 2019-01-02 NOTE — ED Notes (Signed)
Pt ambulated to room from lobby. When pt got to room her SpO2 was at 90%. 2L [place] placed on pt.

## 2019-01-02 NOTE — ED Provider Notes (Signed)
[facility] EMERGENCY DEPARTMENT Provider Note   CSN: [id number] Arrival date & time: 01/01/19  1957     History   Chief Complaint Chief Complaint  Patient presents with  . Cough  . Shortness of Breath    HPI Candace Ramirez is a 43 y.o. female.  Patient presents to the emergency department with a chief complaint of shortness of breath.  She states that the symptoms started suddenly last night.  She reports having some nonproductive cough.  Denies any fevers or chills.  Denies history of asthma or COPD.  She reports having had prior DVT in the left leg, but is no longer anticoagulated for this, as it was years ago.  She denies any treatments prior to arrival.  Denies any known sick contacts.  The history is provided by the patient. No language interpreter was used.    Past Medical History:  Diagnosis Date  . Anemia   . DVT (deep venous thrombosis) ([place]) 2003   also had an infection in left ankle at same time; not pregnant (lovenox/coumadin)  . Fibroid   . Hiatal hernia   . Laceration of right knee with tendon involvement 06/09/2018  . Obesity   . Traumatic tear of lateral meniscus of right knee 06/09/2018    Patient Active Problem List   Diagnosis Date Noted  . Laceration of right knee with tendon involvement 06/09/2018  . Traumatic tear of lateral meniscus of right knee 06/09/2018  . Recurrent ventral hernia 10/12/2016  . Ectopic pregnancy, tubal 07/24/2015  . Menorrhagia with regular cycle 04/03/2015  . Umbilical hernia 67/67/2094  . Left ankle swelling 08/09/2011  . DOMESTIC ABUSE 01/11/2008  . ANEMIA 01/01/2008  . Anxiety state, unspecified 01/01/2008  . HEARTBURN 01/01/2008  . Obesity 02/22/2007  . TOBACCO DEPENDENCE 02/22/2007  . DEEP VEIN THROMBOPHLEBITIS, LEG 02/22/2007    Past Surgical History:  Procedure Laterality Date  . DIAGNOSTIC LAPAROSCOPY WITH REMOVAL OF ECTOPIC PREGNANCY N/A 07/24/2015   Procedure: DIAGNOSTIC LAPAROSCOPY WITH  REMOVAL OF ECTOPIC PREGNANCY;  Surgeon: Donnamae Jude, MD;  Location: [facility];  Service: Gynecology;  Laterality: N/A;  . DILATION AND CURETTAGE OF UTERUS    . HERNIA REPAIR    . HIATAL HERNIA REPAIR    . I&D EXTREMITY Right 06/09/2018   Procedure: IRRIGATION AND DEBRIDEMENT RIGHT KNEE WITH LATERAL MENISCUS REPAIR AND COMPLEX 12CM. LACERATION OF I.Q. BAND REPAIR.;  Surgeon: Marchia Bond, MD;  Location: [facility];  Service: Orthopedics;  Laterality: Right;  . INSERTION OF MESH N/A 10/12/2016   Procedure: INSERTION OF MESH;  Surgeon: Autumn Messing III, MD;  Location: [facility];  Service: General;  Laterality: N/A;  . LAPAROSCOPIC INCISIONAL / UMBILICAL / [place]  10/12/2016   [place] w/mesh/notes 10/12/2016  . NO PAST SURGERIES    . VENTRAL HERNIA REPAIR N/A 10/12/2016   Procedure: LAPAROSCOPIC VENTRAL HERNIA;  Surgeon: Autumn Messing III, MD;  Location: [facility];  Service: General;  Laterality: N/A;     OB History    Gravida  13   Para  5   Term  4   Preterm  1   AB  6   Living  5     SAB      TAB      Ectopic      Multiple      Live Births               Home Medications    Prior to Admission medications  Medication Sig Start Date End Date Taking? Authorizing Provider  cephALEXin (KEFLEX) 500 MG capsule Take 1 capsule (500 mg total) by mouth 4 (four) times daily. 06/09/18   Marchia Bond, MD  diphenhydrAMINE (BENADRYL) 25 MG tablet Take 1 tablet (25 mg total) by mouth every 6 (six) hours. 06/26/18   Hedges, Dellis Filbert, PA-C  HYDROcodone-acetaminophen (NORCO) 5-325 MG tablet Take 1-2 tablets by mouth every 6 (six) hours as needed for moderate pain. MAXIMUM TOTAL ACETAMINOPHEN DOSE IS 4000 MG PER DAY 06/09/18   Marchia Bond, MD  Multiple Vitamins-Minerals (HAIR SKIN AND NAILS FORMULA) TABS Take 1 tablet by mouth daily.    [provider]  ondansetron (ZOFRAN) 4 MG tablet Take 1 tablet (4 mg total) by mouth every 8 (eight) hours as needed for nausea. 06/09/18   Marchia Bond, MD    Family History Family History  Problem Relation Age of Onset  . COPD Mother   . Hypertension Mother   . Drug abuse Father        died of drug overdose    Social History Social History   Tobacco Use  . Smoking status: Current Every Day Smoker    Packs/day: 0.50    Types: Cigarettes  . Smokeless tobacco: Never Used  Substance Use Topics  . Alcohol use: Not Currently    Alcohol/week: 0.0 standard drinks    Comment: socially  . Drug use: Yes    Types: Marijuana    Comment: smokes once a week     Allergies   No known allergies   Review of Systems Review of Systems  All other systems reviewed and are negative.    Physical Exam Updated Vital Signs BP (!) 149/78 (BP Location: Right Arm)   Pulse 93   Temp 99.2 F (37.3 C) (Oral)   Resp 16   Ht 5\' 5"  (1.651 m)   Wt 112.5 kg   LMP 12/31/2018   SpO2 100%   BMI 41.27 kg/m   Physical Exam Vitals signs and nursing note reviewed.  Constitutional:      Appearance: She is well-developed.  HENT:     Head: Normocephalic and atraumatic.  Eyes:     Conjunctiva/sclera: Conjunctivae normal.     Pupils: Pupils are equal, round, and reactive to light.  Neck:     Musculoskeletal: Normal range of motion and neck supple.  Cardiovascular:     Rate and Rhythm: Normal rate and regular rhythm.     Heart sounds: No murmur. No friction rub. No gallop.   Pulmonary:     Effort: Respiratory distress present.     Breath sounds: Decreased breath sounds and wheezing present. No rales.     Comments: Mild respiratory distress, diffuse wheezing throughout, speaks in short sentences Chest:     Chest wall: No tenderness.  Abdominal:     General: Bowel sounds are normal. There is no distension.     Palpations: Abdomen is soft. There is no mass.     Tenderness: There is no abdominal tenderness. There is no guarding or rebound.  Musculoskeletal: Normal range of motion.        General: No tenderness.  Skin:    General:  Skin is warm and dry.  Neurological:     Mental Status: She is alert and oriented to person, place, and time.  Psychiatric:        Behavior: Behavior normal.        Thought Content: Thought content normal.  Judgment: Judgment normal.      ED Treatments / Results  Labs (all labs ordered are listed, but only abnormal results are displayed) Labs Reviewed  CBC WITH DIFFERENTIAL/PLATELET - Abnormal; Notable for the following components:      Result Value   Hemoglobin 8.6 (*)    HCT 32.3 (*)    MCV 76.4 (*)    MCH 20.3 (*)    MCHC 26.6 (*)    RDW 19.0 (*)    All other components within normal limits  BASIC METABOLIC PANEL - Abnormal; Notable for the following components:   Potassium 3.2 (*)    BUN 5 (*)    Calcium 8.7 (*)    All other components within normal limits  I-STAT ARTERIAL BLOOD GAS, ED - Abnormal; Notable for the following components:   pO2, Arterial 75.0 (*)    TCO2 21 (*)    Acid-base deficit 5.0 (*)    All other components within normal limits  INFLUENZA PANEL BY PCR (TYPE A & B)  BLOOD GAS, ARTERIAL    EKG EKG Interpretation  Date/Time:  Wednesday January 02 2019 00:21:04 EST Ventricular Rate:  102 PR Interval:    QRS Duration: 84 QT Interval:  339 QTC Calculation: 442 R Axis:   73 Text Interpretation:  Sinus tachycardia No significant change since last tracing Confirmed by Pryor Curia [phone]) on 01/02/2019 12:29:38 AM   Radiology Dg Chest 2 View  Result Date: 01/01/2019 CLINICAL DATA:  Dyspnea and cough since 11/30 last evening. EXAM: CHEST - 2 VIEW COMPARISON:  04/08/2018 FINDINGS: The heart size and mediastinal contours are within normal limits. Mild interstitial prominence compatible with chronic bronchitic change. No alveolar consolidation to suggest pneumonia. There is minimal atelectasis at the left base. No effusion or pneumothorax. No pulmonary edema. Calcific rotator cuff tendinopathy is seen on the right. IMPRESSION: 1. Chronic bronchitic  change of the lungs. 2. Calcific right sided rotator cuff tendinosis. Electronically Signed   By: Ashley Royalty M.D.   On: 01/01/2019 21:20    Procedures Procedures (including critical care time) CRITICAL CARE Performed by: Montine Circle  Mild respiratory distress, multiple rechecks, mild hypoxia 88%, continuous albuterol treatments. Total critical care time: 55 minutes  Critical care time was exclusive of separately billable procedures and treating other patients.  Critical care was necessary to treat or prevent imminent or life-threatening deterioration.  Critical care was time spent personally by me on the following activities: development of treatment plan with patient and/or surrogate as well as nursing, discussions with consultants, evaluation of patient's response to treatment, examination of patient, obtaining history from patient or surrogate, ordering and performing treatments and interventions, ordering and review of laboratory studies, ordering and review of radiographic studies, pulse oximetry and re-evaluation of patient's condition.  Medications Ordered in ED Medications  ipratropium-albuterol (DUONEB) 0.5-2.5 (3) MG/3ML nebulizer solution 5 mL (has no administration in time range)  methylPREDNISolone sodium succinate (SOLU-MEDROL) 125 mg/2 mL injection 125 mg (has no administration in time range)     Initial Impression / Assessment and Plan / ED Course  I have reviewed the triage vital signs and the nursing notes.  Pertinent labs & imaging results that were available during my care of the patient were reviewed by me and considered in my medical decision making (see chart for details).    Patient with wheezing and cough.  Onset yesterday.  Afebrile.    CXR shows chronic bronchitic changes.  Will give neb and solumedrol and reassess.  Patient reassessed.  She continues to have diffuse wheezing and remained short of breath.  O2 saturation drops to 88% on room air.   Will give continuous albuterol treatment.  Will also add magnesium.  Anticipate for admission.  2:29 AM On reassessment, patient refuses to continue with a continuous albuterol treatment due to anxiety.  She does have improved breath sounds on auscultation.  However, her O2 saturation continues to drop down into the upper 80s despite being on nasal cannula 2 L.  Will broaden work-up to include PE, and will proceed with CT PE study.  Patient seen by and discussed with Dr. Leonides Schanz, who agrees with plan.  CT shows no evidence of PE, but is concerning for pneumonia.  However, patient doesn't have leukocytosis or fever.  Will cover with antibiotics.  Flu test pending.    Appreciate Dr. Marlowe Sax for admitting the patient.    Final Clinical Impressions(s) / ED Diagnoses   Final diagnoses:  Wheezing  SOB (shortness of breath)  Community acquired pneumonia of left upper lobe of lung [facility])    ED Discharge Orders    None       Montine Circle, PA-C 01/02/19 0345    Ward, Delice Bison, DO 01/02/19 [phone]

## 2019-01-02 NOTE — H&P (Addendum)
History and Physical    DOYLE TEGETHOFF ZOX:[id number] DOB: [birth year] DOA: 01/01/2019  PCP: Lovenia Kim, MD Patient coming from: Home  Chief Complaint: Shortness of breath, cough  HPI: Candace Ramirez is a 43 y.o. female with medical history significant of remote history of DVT, anemia, obesity presenting to the hospital for evaluation of shortness of breath and cough.  Patient reports having shortness of breath at rest, fatigue, and a dry cough for the past 2 days.  States she started wheezing yesterday.  Denies any history of asthma or COPD and does not use any home inhalers.  Denies having any chest pain, fevers, or chills.  States she had a blood clot in her leg 17 years ago and was treated with a blood thinner for 6 months at that time.  ED Course: Patient noted to have wheezing on exam.  Oxygen saturation 88% on room air.  Continuous nebulizer treatment ordered but patient refused due to anxiety.  Oxygen saturation continued to drop into the upper 80s despite being on 2 L oxygen via nasal cannula.  CTA without evidence of PE but concerning for pneumonia.  No leukocytosis or fever.  She was started on antibiotics.  Placed on high flow nasal cannula.  Review of Systems: As per HPI otherwise 10 point review of systems negative.  Past Medical History:  Diagnosis Date  . Anemia   . DVT (deep venous thrombosis) ([place]) 2003   also had an infection in left ankle at same time; not pregnant (lovenox/coumadin)  . Fibroid   . Hiatal hernia   . Laceration of right knee with tendon involvement 06/09/2018  . Obesity   . Traumatic tear of lateral meniscus of right knee 06/09/2018    Past Surgical History:  Procedure Laterality Date  . DIAGNOSTIC LAPAROSCOPY WITH REMOVAL OF ECTOPIC PREGNANCY N/A 07/24/2015   Procedure: DIAGNOSTIC LAPAROSCOPY WITH REMOVAL OF ECTOPIC PREGNANCY;  Surgeon: Donnamae Jude, MD;  Location: [facility];  Service: Gynecology;  Laterality: N/A;  . DILATION AND CURETTAGE OF UTERUS     . HERNIA REPAIR    . HIATAL HERNIA REPAIR    . I&D EXTREMITY Right 06/09/2018   Procedure: IRRIGATION AND DEBRIDEMENT RIGHT KNEE WITH LATERAL MENISCUS REPAIR AND COMPLEX 12CM. LACERATION OF I.Q. BAND REPAIR.;  Surgeon: Marchia Bond, MD;  Location: [place];  Service: Orthopedics;  Laterality: Right;  . INSERTION OF MESH N/A 10/12/2016   Procedure: INSERTION OF MESH;  Surgeon: Autumn Messing III, MD;  Location: [place];  Service: General;  Laterality: N/A;  . LAPAROSCOPIC INCISIONAL / UMBILICAL / [place]  10/12/2016   [place] w/mesh/notes 10/12/2016  . NO PAST SURGERIES    . VENTRAL HERNIA REPAIR N/A 10/12/2016   Procedure: LAPAROSCOPIC VENTRAL HERNIA;  Surgeon: Autumn Messing III, MD;  Location: [place];  Service: General;  Laterality: N/A;     reports that she has been smoking cigarettes. She has been smoking about 0.50 packs per day. She has never used smokeless tobacco. She reports previous alcohol use. She reports current drug use. Drug: Marijuana.  Allergies  Allergen Reactions  . No Known Allergies     Family History  Problem Relation Age of Onset  . COPD Mother   . Hypertension Mother   . Drug abuse Father        died of drug overdose    Prior to Admission medications   Medication Sig Start Date End Date Taking? Authorizing Provider  cephALEXin (KEFLEX) 500 MG capsule Take 1  capsule (500 mg total) by mouth 4 (four) times daily. Patient not taking: Reported on 01/02/2019 06/09/18   Marchia Bond, MD  diphenhydrAMINE (BENADRYL) 25 MG tablet Take 1 tablet (25 mg total) by mouth every 6 (six) hours. Patient not taking: Reported on 01/02/2019 06/26/18   Hedges, Dellis Filbert, PA-C  HYDROcodone-acetaminophen (NORCO) 5-325 MG tablet Take 1-2 tablets by mouth every 6 (six) hours as needed for moderate pain. MAXIMUM TOTAL ACETAMINOPHEN DOSE IS 4000 MG PER DAY Patient not taking: Reported on 01/02/2019 06/09/18   Marchia Bond, MD  ondansetron (ZOFRAN) 4 MG tablet Take 1 tablet (4 mg total) by mouth  every 8 (eight) hours as needed for nausea. Patient not taking: Reported on 01/02/2019 06/09/18   Marchia Bond, MD    Physical Exam: Vitals:   01/02/19 0330 01/02/19 0438 01/02/19 0441 01/02/19 0630  BP:  (!) 121/47 (!) 114/50 116/62  Pulse: (!) 122 (!) 117 (!) 125 (!) 116  Resp: (!) 25 (!) 24 (!) 25 (!) 23  Temp:      TempSrc:      SpO2: 98% 95% 95% 94%  Weight:      Height:        Physical Exam  Constitutional: She is oriented to person, place, and time. She appears well-developed and well-nourished. No distress.  HENT:  Head: Normocephalic.  Mouth/Throat: Oropharynx is clear and moist.  Eyes: Right eye exhibits no discharge. Left eye exhibits no discharge.  Neck: Neck supple.  Cardiovascular: Normal rate, regular rhythm and intact distal pulses.  Pulmonary/Chest:  Speaking clearly in full sentences Slightly increased work of breathing Coughing Coarse breath sounds appreciated diffusely No wheezing On 10 L oxygen via high flow nasal cannula  Abdominal: Soft. Bowel sounds are normal. She exhibits no distension. There is no abdominal tenderness. There is no guarding.  Musculoskeletal:        General: No edema.  Neurological: She is alert and oriented to person, place, and time.  Skin: Skin is warm and dry. She is not diaphoretic.  Psychiatric: She has a normal mood and affect. Her behavior is normal.     Labs on Admission: I have personally reviewed following labs and imaging studies  CBC: Recent Labs  Lab 01/02/19 0042  WBC 8.0  NEUTROABS 6.1  HGB 8.6*  HCT 32.3*  MCV 76.4*  PLT 950   Basic Metabolic Panel: Recent Labs  Lab 01/02/19 0042  NA 140  K 3.2*  CL 109  CO2 23  GLUCOSE 95  BUN 5*  CREATININE 0.84  CALCIUM 8.7*   GFR: Estimated Creatinine Clearance: 109.1 mL/min (by C-G formula based on SCr of 0.84 mg/dL). Liver Function Tests: No results for input(s): AST, ALT, ALKPHOS, BILITOT, PROT, ALBUMIN in the last 168 hours. No results for  input(s): LIPASE, AMYLASE in the last 168 hours. No results for input(s): AMMONIA in the last 168 hours. Coagulation Profile: No results for input(s): INR, PROTIME in the last 168 hours. Cardiac Enzymes: No results for input(s): CKTOTAL, CKMB, CKMBINDEX, TROPONINI in the last 168 hours. BNP (last 3 results) No results for input(s): PROBNP in the last 8760 hours. HbA1C: No results for input(s): HGBA1C in the last 72 hours. CBG: No results for input(s): GLUCAP in the last 168 hours. Lipid Profile: No results for input(s): CHOL, HDL, LDLCALC, TRIG, CHOLHDL, LDLDIRECT in the last 72 hours. Thyroid Function Tests: No results for input(s): TSH, T4TOTAL, FREET4, T3FREE, THYROIDAB in the last 72 hours. Anemia Panel: No results for input(s): VITAMINB12, FOLATE,  FERRITIN, TIBC, IRON, RETICCTPCT in the last 72 hours. Urine analysis:    Component Value Date/Time   COLORURINE YELLOW 07/21/2015 1120   APPEARANCEUR CLOUDY (A) 07/21/2015 1120   LABSPEC 1.025 07/21/2015 1120   PHURINE 6.5 07/21/2015 1120   GLUCOSEU NEGATIVE 07/21/2015 1120   HGBUR NEGATIVE 07/21/2015 [place] 07/21/2015 1120   KETONESUR NEGATIVE 07/21/2015 1120   PROTEINUR NEGATIVE 07/21/2015 1120   UROBILINOGEN 0.2 07/21/2015 1120   NITRITE NEGATIVE 07/21/2015 1120   LEUKOCYTESUR NEGATIVE 07/21/2015 1120    Radiological Exams on Admission: Dg Chest 2 View  Result Date: 01/01/2019 CLINICAL DATA:  Dyspnea and cough since 11/30 last evening. EXAM: CHEST - 2 VIEW COMPARISON:  04/08/2018 FINDINGS: The heart size and mediastinal contours are within normal limits. Mild interstitial prominence compatible with chronic bronchitic change. No alveolar consolidation to suggest pneumonia. There is minimal atelectasis at the left base. No effusion or pneumothorax. No pulmonary edema. Calcific rotator cuff tendinopathy is seen on the right. IMPRESSION: 1. Chronic bronchitic change of the lungs. 2. Calcific right sided rotator  cuff tendinosis. Electronically Signed   By: Ashley Royalty M.D.   On: 01/01/2019 21:20   Ct Angio Chest Pe W And/or Wo Contrast  Result Date: 01/02/2019 CLINICAL DATA:  PE suspected, high pretest prob. Cough and shortness of breath. History of DVT. EXAM: CT ANGIOGRAPHY CHEST WITH CONTRAST TECHNIQUE: Multidetector CT imaging of the chest was performed using the standard protocol during bolus administration of intravenous contrast. Multiplanar CT image reconstructions and MIPs were obtained to evaluate the vascular anatomy. CONTRAST:  75 cc ISOVUE-370 IOPAMIDOL (ISOVUE-370) INJECTION 76% COMPARISON:  Radiographs earlier this day. FINDINGS: Cardiovascular: There are no filling defects within the pulmonary arteries to the lobar level suggest pulmonary embolus. Segmental and subsegmental branches could not be assessed due to contrast bolus timing and soft tissue attenuation from habitus. The thoracic aorta is normal in caliber without dissection. Heart is normal in size. No pericardial effusion. Mediastinum/Nodes: No enlarged mediastinal or hilar lymph nodes. No visualized thyroid nodule. Patulous esophagus is small to moderate hiatal hernia. Lungs/Pleura: Irregular anterior right upper lobe opacities suspicious for pneumonia. Anterior left upper lobe opacities as well as dependent opacities in both lower lobes to a lesser extent. Heterogeneous attenuation in the right lower lobe can be seen with small airways disease or air trapping. There is central bronchial thickening. No pleural fluid. Upper Abdomen: No acute abnormality. Musculoskeletal: There are no acute or suspicious osseous abnormalities. Review of the MIP images confirms the above findings. IMPRESSION: 1. No central pulmonary embolus. Segmental and subsegmental branches could not be assessed due to contrast bolus timing and soft tissue attenuation from habitus. 2. Patchy opacities in the right upper lobe suspicious for pneumonia. Additional opacities in the  left upper lobe and both lower lobes may be atelectasis or additional pneumonia. 3. Central bronchial thickening. Heterogeneous attenuation in the right lower lobe can be seen with small airways disease or air trapping. Electronically Signed   By: Keith Rake M.D.   On: 01/02/2019 03:27    EKG: Independently reviewed.  Sinus tachycardia (heart rate 102).  Assessment/Plan Principal Problem:   Acute respiratory failure with hypoxia (HCC) Active Problems:   Chronic anemia   Tobacco use   Hypokalemia   Acute hypoxic respiratory failure secondary to community-acquired pneumonia and acute exacerbation of reactive airway disease -Tachycardic and tachypneic.  Oxygen saturation 88% on room air.  Continued to be in the upper 80s despite patient receiving 2  L supplemental oxygen via nasal cannula.  ABG showing pH 7.38, PCO2 33, PO2 75.  Noted to be wheezing by ED provider. -Afebrile and no leukocytosis.  Not hypotensive.  Influenza panel negative.  CTA negative for PE.  Showing patchy opacities in the right upper lobe suspicious for pneumonia.  Also showing additional opacities in the left upper lobe and both lower lobes which may reflect atelectasis versus pneumonia.  Currently on 10 L oxygen via high flow nasal cannula. -Started on ceftriaxone and azithromycin in the ED.  Continue antibiotics. -Patient received Solu-Medrol 125 mg and IV magnesium sulfate 2 g in the ED.  Continue Solu-Medrol 60 mg every 8 hours. -Levalbuterol-ipratropium every 6 hours -Tessalon Perles PRN cough -No documented history of COPD or asthma.  Patient is a smoker.  Will need outpatient PFTs.  Mild hypokalemia Potassium 3.2. -Replete potassium.  Check magnesium level.  Continue to monitor electrolyte panel.  Chronic microcytic anemia -Hemoglobin 8.6, at recent baseline. -Check iron, ferritin, TIBC  Tobacco use -NicoDerm patch  DVT prophylaxis: Lovenox Code Status: Full code Family Communication: No family  available. Disposition Plan: Anticipate discharge after clinical improvement. Consults called: None Admission status: Observation, progressive care unit   Shela Leff MD [facility] Pager [phone]  If 7PM-7AM, please contact night-coverage [email or web address] Password [facility]  01/02/2019, 6:49 AM

## 2019-01-02 NOTE — ED Provider Notes (Signed)
Medical screening examination/treatment/procedure(s) were conducted as a shared visit with non-physician practitioner(s) and myself.  I personally evaluated the patient during the encounter.  EKG Interpretation  Date/Time:  Wednesday January 02 2019 00:21:04 EST Ventricular Rate:  102 PR Interval:    QRS Duration: 84 QT Interval:  339 QTC Calculation: 442 R Axis:   73 Text Interpretation:  Sinus tachycardia No significant change since last tracing Confirmed by Pryor Curia [phone]) on 01/02/2019 12:29:38 AM   Patient is a 43 year old female with history of tobacco use who presents to the emergency department with cough, wheezing.  Patient has new oxygen requirement.  No history of asthma or COPD.  Has had remote history of DVT previously but no PE and is no longer on anticoagulation.  Chest x-ray shows chronic bronchitic changes without acute abnormality.  Patient's lungs have cleared and she has better aeration after breathing treatments but is still hypoxic with sats dropping into the low 80s on nasal cannula.  CTA obtained which shows no central pulmonary embolus but does show bilateral pneumonia.  Patient is doing well currently on high flow nasal cannula.  Will give antibiotics and admit.   [place] Troia, Delice Bison, DO 01/02/19 [phone]

## 2019-01-03 DIAGNOSIS — E876 Hypokalemia: Secondary | ICD-10-CM

## 2019-01-03 DIAGNOSIS — R0902 Hypoxemia: Secondary | ICD-10-CM

## 2019-01-03 DIAGNOSIS — J181 Lobar pneumonia, unspecified organism: Secondary | ICD-10-CM

## 2019-01-03 DIAGNOSIS — J189 Pneumonia, unspecified organism: Secondary | ICD-10-CM

## 2019-01-03 DIAGNOSIS — D649 Anemia, unspecified: Secondary | ICD-10-CM

## 2019-01-03 DIAGNOSIS — Z720 Tobacco use: Secondary | ICD-10-CM

## 2019-01-03 LAB — CBC
HCT: 29.7 % — ABNORMAL LOW (ref 36.0–46.0)
Hemoglobin: 8.3 g/dL — ABNORMAL LOW (ref 12.0–15.0)
MCH: 20.9 pg — ABNORMAL LOW (ref 26.0–34.0)
MCHC: 27.9 g/dL — ABNORMAL LOW (ref 30.0–36.0)
MCV: 74.6 fL — ABNORMAL LOW (ref 80.0–100.0)
Platelets: 398 10*3/uL (ref 150–400)
RBC: 3.98 MIL/uL (ref 3.87–5.11)
RDW: 18.7 % — ABNORMAL HIGH (ref 11.5–15.5)
WBC: 18.3 10*3/uL — ABNORMAL HIGH (ref 4.0–10.5)
nRBC: 0 % (ref 0.0–0.2)

## 2019-01-03 LAB — BASIC METABOLIC PANEL
Anion gap: 5 (ref 5–15)
BUN: 9 mg/dL (ref 6–20)
CO2: 23 mmol/L (ref 22–32)
Calcium: 9.3 mg/dL (ref 8.9–10.3)
Chloride: 110 mmol/L (ref 98–111)
Creatinine, Ser: 0.88 mg/dL (ref 0.44–1.00)
GFR calc Af Amer: >60 mL/min (ref >60)
GFR calc non Af Amer: >60 mL/min (ref >60)
GLUCOSE: 127 mg/dL — AB (ref 70–99)
Potassium: 4.2 mmol/L (ref 3.5–5.1)
Sodium: 138 mmol/L (ref 135–145)

## 2019-01-03 LAB — PROCALCITONIN: Procalcitonin: 0.42 ng/mL

## 2019-01-03 MED ORDER — IPRATROPIUM BROMIDE 0.02 % IN SOLN
0.5000 mg | Freq: Three times a day (TID) | RESPIRATORY_TRACT | Status: DC
  Administered 2019-01-03 (×2): 0.5 mg via RESPIRATORY_TRACT
  Filled 2019-01-03 (×4): qty 2.5

## 2019-01-03 MED ORDER — LEVALBUTEROL HCL 0.63 MG/3ML IN NEBU
0.6300 mg | INHALATION_SOLUTION | Freq: Three times a day (TID) | RESPIRATORY_TRACT | Status: DC
  Administered 2019-01-03 (×2): 0.63 mg via RESPIRATORY_TRACT
  Filled 2019-01-03 (×4): qty 3

## 2019-01-03 MED ORDER — AZITHROMYCIN 250 MG PO TABS
500.0000 mg | ORAL_TABLET | ORAL | Status: DC
  Administered 2019-01-04: 500 mg via ORAL
  Filled 2019-01-03: qty 2

## 2019-01-03 MED ORDER — FERROUS SULFATE 325 (65 FE) MG PO TABS
325.0000 mg | ORAL_TABLET | ORAL | Status: DC
  Administered 2019-01-03: 325 mg via ORAL
  Filled 2019-01-03: qty 1

## 2019-01-03 MED ORDER — ONDANSETRON HCL 4 MG/2ML IJ SOLN
4.0000 mg | Freq: Four times a day (QID) | INTRAMUSCULAR | Status: DC | PRN
  Administered 2019-01-03 – 2019-01-04 (×2): 4 mg via INTRAVENOUS
  Filled 2019-01-03 (×2): qty 2

## 2019-01-03 MED ORDER — PREDNISONE 10 MG PO TABS: 10.0000 mg | ORAL_TABLET | Freq: Every day | ORAL | Status: DC

## 2019-01-03 MED ORDER — PREDNISONE 20 MG PO TABS
40.0000 mg | ORAL_TABLET | Freq: Every day | ORAL | Status: DC
  Administered 2019-01-04: 40 mg via ORAL
  Filled 2019-01-03: qty 2

## 2019-01-03 NOTE — Care Management Note (Addendum)
Case Management Note  Patient Details  Name: LANEE CHAIN MRN: [id number] Date of Birth: February 03, 1976  Subjective/Objective:   NCM spoke with patient , she states she has Medicaid, NCM contacted financial counseling, she ran patient's information and it came back showing Medicaid is not active. NCM informed patient and she will need assist with Medications at discharge with Match Letter.   Patient goes to [facility] practice for PCP.  NCM gave patient Match letter to assist with medications at dc.                Action/Plan: DC when ready.  Expected Discharge Date:                  Expected Discharge Plan:  Home/Self Care  In-House Referral:     Discharge planning Services  CM Consult, Medication Assistance, [place] [facility]  Post Acute Care Choice:    Choice offered to:     DME Arranged:    DME Agency:     HH Arranged:    HH Agency:     Status of Service:  Completed, signed off  If discussed at [employer] of Stay Meetings, dates discussed:    Additional Comments:  Zenon Mayo, RN 01/03/2019, 1:01 PM

## 2019-01-03 NOTE — Progress Notes (Addendum)
Family Medicine Teaching Service Daily Progress Note Intern Pager: [phone]  Patient name: Candace Ramirez Medical record number: [id number] Date of birth: 1976/07/14 Age: 43 y.o. Gender: female  Primary Care Provider: Lovenia Kim, MD Consultants: none, transferred from hospitalists Code Status: full  Pt Overview and Major Events to Date:  1/7 admitted 1/9 family medicine assumed care  Assessment and Plan: 43yo female presents with 3 days wheezing, cough, SOB, malaise. Found to be hypoxic on presentation to upper 80s. Afebrile. Wheezing improved with nebulizer but patient unable to tolerate treatments. PMHx significant for remote history of DVT, anemia, obesity.  Reactive airway disease exacerbation, CAP  No history of COPD or asthma.  Patient is a current everyday smoker.  Wheezing improved with nebulizer. CXR revealed chronic bronchitic changes and ECG showed sinus tachycardia without ischemic features. Flu, strep pneumo urine antigen negative. Patient is current smoker. Cannot r/o subsegmental PE with CTA inconclusive 2/2 body habitus and timing of contrast.  CTA suggestive of right upper lobe, left upper lobe, and b/l lower lobe pneumonia. Procalcitonin neg, afebrile, tahcycardic to 141 overnight on telemetry. Sputum culture ordered, but not collected.  Overnight- on room air but desat to 80s so put on oxygen. Currently 3L [place] 98%.  Patient states that her breathing is improving. Notes sweating and nausea with medications.  Likely 2/2 azithromycin vs steroids. - cont azithromycin (1/8-), transition to PO - cont ceftriaxone (1/8-) - cont sch atrovent and xopenex  -Continuous pulse ox -Cardiac monitoring -d/c Methylprednisolone 60 mg every 8 hours as has been on 24 hrs - start pred 40mg  PO to complete 5 day course of steroids -S/p IV mag sulfate 2 g in ED -Outpatient PFTs as no history of COPD or asthma  Hypokalemia K 3.4 yesterday, repleated with K-Dur 401.  Magnesium 2.0.  K this  a.m.4.2. -cont to monitor BMP  Iron deficiency anemia- chronic, stable  Hgb 8.6 on admission. hgb 8.3 today.  Iron 15.  TIBC 405. -We will continue to monitor CBC -Ferrous sulfate QOD  Tobacco use disorder Patient is a current everyday smoker. -Recommend continued PCP discussions for tobacco cessation -Bacot cessation counseling provided per note from [facility]  H/o menorrhagia- no current vaginal bleeding -Likely contributing to her iron deficiency anemia -plan per above  H/o DVT- 17 years ago. no longer on anticoagulation -Receiving Lovenox while inpatient -No intervention needed at this time   FEN/GI: Heart healthy PPx: Lovenox  Disposition: Home pending clinical improvement  Subjective:  Patient states that her breathing is improving this AM.  Encouraged to get up and sit up in chair and she was very excited about this and states, "I love to move around."  No chest pain.  Notes some nausea and sweating with "antibtioics" this AM and overnight."  Objective: Temp:  [97.8 F (36.6 C)-99 F (37.2 C)] 97.8 F (36.6 C) (01/09 0731) Pulse Rate:  [104-117] 106 (01/09 0731) Resp:  [15-29] 20 (01/09 0731) BP: (127-143)/(57-79) 127/79 (01/09 0731) SpO2:  [89 %-100 %] 100 % (01/09 0929)  Physical Exam: General: 43 y.o. female in NAD Cardio: RRR no m/r/g Lungs: decreased breath sounds RLL, good air-movement, breathing comfortably on 3L per [place] Abdomen: Soft, non-tender to palpation, positive bowel sounds Skin: warm and dry Extremities: No edema   Laboratory: Recent Labs  Lab 01/02/19 0042 01/03/19 0330  WBC 8.0 18.3*  HGB 8.6* 8.3*  HCT 32.3* 29.7*  PLT 375 398   Recent Labs  Lab 01/02/19 0042 01/02/19 0944 01/03/19 0839  NA  140 138 138  K 3.2* 3.4* 4.2  CL 109 108 110  CO2 23 19* 23  BUN 5* <5* 9  CREATININE 0.84 0.95 0.88  CALCIUM 8.7* 9.1 9.3  GLUCOSE 95 175* 127*    procalcitonin neg  Imaging/Diagnostic Tests: Dg Chest 2 View  Result Date:  01/01/2019 CLINICAL DATA:  Dyspnea and cough since 11/30 last evening. EXAM: CHEST - 2 VIEW COMPARISON:  04/08/2018 FINDINGS: The heart size and mediastinal contours are within normal limits. Mild interstitial prominence compatible with chronic bronchitic change. No alveolar consolidation to suggest pneumonia. There is minimal atelectasis at the left base. No effusion or pneumothorax. No pulmonary edema. Calcific rotator cuff tendinopathy is seen on the right. IMPRESSION: 1. Chronic bronchitic change of the lungs. 2. Calcific right sided rotator cuff tendinosis. Electronically Signed   By: Ashley Royalty M.D.   On: 01/01/2019 21:20   Ct Angio Chest Pe W And/or Wo Contrast  Result Date: 01/02/2019 CLINICAL DATA:  PE suspected, high pretest prob. Cough and shortness of breath. History of DVT. EXAM: CT ANGIOGRAPHY CHEST WITH CONTRAST TECHNIQUE: Multidetector CT imaging of the chest was performed using the standard protocol during bolus administration of intravenous contrast. Multiplanar CT image reconstructions and MIPs were obtained to evaluate the vascular anatomy. CONTRAST:  75 cc ISOVUE-370 IOPAMIDOL (ISOVUE-370) INJECTION 76% COMPARISON:  Radiographs earlier this day. FINDINGS: Cardiovascular: There are no filling defects within the pulmonary arteries to the lobar level suggest pulmonary embolus. Segmental and subsegmental branches could not be assessed due to contrast bolus timing and soft tissue attenuation from habitus. The thoracic aorta is normal in caliber without dissection. Heart is normal in size. No pericardial effusion. Mediastinum/Nodes: No enlarged mediastinal or hilar lymph nodes. No visualized thyroid nodule. Patulous esophagus is small to moderate hiatal hernia. Lungs/Pleura: Irregular anterior right upper lobe opacities suspicious for pneumonia. Anterior left upper lobe opacities as well as dependent opacities in both lower lobes to a lesser extent. Heterogeneous attenuation in the right lower lobe  can be seen with small airways disease or air trapping. There is central bronchial thickening. No pleural fluid. Upper Abdomen: No acute abnormality. Musculoskeletal: There are no acute or suspicious osseous abnormalities. Review of the MIP images confirms the above findings. IMPRESSION: 1. No central pulmonary embolus. Segmental and subsegmental branches could not be assessed due to contrast bolus timing and soft tissue attenuation from habitus. 2. Patchy opacities in the right upper lobe suspicious for pneumonia. Additional opacities in the left upper lobe and both lower lobes may be atelectasis or additional pneumonia. 3. Central bronchial thickening. Heterogeneous attenuation in the right lower lobe can be seen with small airways disease or air trapping. Electronically Signed   By: Keith Rake M.D.   On: 01/02/2019 03:27     Tuttle, Bernita Raisin, DO 01/03/2019, 9:51 AM PGY-1, [place] Intern pager: [phone], text pages welcome

## 2019-01-03 NOTE — Progress Notes (Signed)
SATURATION QUALIFICATIONS: (This note is used to comply with regulatory documentation for home oxygen)  Patient Saturations on Room Air at Rest = 94%  Patient Saturations on Room Air while Ambulating = 84%  Patient Saturations on 0 Liters of oxygen while Ambulating = 0%  Please briefly explain why patient needs home oxygen:  Patient was SOB while walking and walked 350 ft. Patient paused couple of times to catch her breath.

## 2019-01-04 ENCOUNTER — Other Ambulatory Visit: Payer: Self-pay | Admitting: Family Medicine

## 2019-01-04 DIAGNOSIS — S86921A Laceration of unspecified muscle(s) and tendon(s) at lower leg level, right leg, initial encounter: Principal | ICD-10-CM

## 2019-01-04 DIAGNOSIS — Z720 Tobacco use: Secondary | ICD-10-CM

## 2019-01-04 DIAGNOSIS — S81011A Laceration without foreign body, right knee, initial encounter: Secondary | ICD-10-CM

## 2019-01-04 LAB — CBC
HEMATOCRIT: 28.3 % — AB (ref 36.0–46.0)
HEMOGLOBIN: 7.6 g/dL — AB (ref 12.0–15.0)
MCH: 20.1 pg — ABNORMAL LOW (ref 26.0–34.0)
MCHC: 26.9 g/dL — ABNORMAL LOW (ref 30.0–36.0)
MCV: 74.7 fL — ABNORMAL LOW (ref 80.0–100.0)
Platelets: 358 10*3/uL (ref 150–400)
RBC: 3.79 MIL/uL — ABNORMAL LOW (ref 3.87–5.11)
RDW: 18.9 % — ABNORMAL HIGH (ref 11.5–15.5)
WBC: 14.1 10*3/uL — ABNORMAL HIGH (ref 4.0–10.5)
nRBC: 0.1 % (ref 0.0–0.2)

## 2019-01-04 LAB — PROCALCITONIN: Procalcitonin: 0.3 ng/mL

## 2019-01-04 LAB — BASIC METABOLIC PANEL
Anion gap: 7 (ref 5–15)
BUN: 14 mg/dL (ref 6–20)
CHLORIDE: 107 mmol/L (ref 98–111)
CO2: 25 mmol/L (ref 22–32)
Calcium: 8.6 mg/dL — ABNORMAL LOW (ref 8.9–10.3)
Creatinine, Ser: 0.91 mg/dL (ref 0.44–1.00)
GFR calc Af Amer: >60 mL/min (ref >60)
GFR calc non Af Amer: >60 mL/min (ref >60)
GLUCOSE: 90 mg/dL (ref 70–99)
Potassium: 3.5 mmol/L (ref 3.5–5.1)
Sodium: 139 mmol/L (ref 135–145)

## 2019-01-04 MED ORDER — FERROUS SULFATE 325 (65 FE) MG PO TABS: 325.0000 mg | ORAL_TABLET | ORAL | 0 refills | Status: DC

## 2019-01-04 MED ORDER — NICOTINE 14 MG/24HR TD PT24: 14.0000 mg | MEDICATED_PATCH | Freq: Every day | TRANSDERMAL | 0 refills | Status: DC

## 2019-01-04 MED ORDER — ALBUTEROL SULFATE HFA 108 (90 BASE) MCG/ACT IN AERS: 2.0000 | INHALATION_SPRAY | Freq: Four times a day (QID) | RESPIRATORY_TRACT | 0 refills | Status: DC | PRN

## 2019-01-04 MED ORDER — CEFDINIR 300 MG PO CAPS: 300.0000 mg | ORAL_CAPSULE | Freq: Two times a day (BID) | ORAL | Status: DC

## 2019-01-04 MED ORDER — PREDNISONE 20 MG PO TABS: 40.0000 mg | ORAL_TABLET | Freq: Every day | ORAL | 0 refills | Status: AC

## 2019-01-04 MED ORDER — CEFDINIR 300 MG PO CAPS: 300.0000 mg | ORAL_CAPSULE | Freq: Two times a day (BID) | ORAL | 0 refills | Status: AC

## 2019-01-04 MED ORDER — AZITHROMYCIN 250 MG PO TABS: 500.0000 mg | ORAL_TABLET | Freq: Every day | ORAL | 0 refills | Status: AC

## 2019-01-04 NOTE — Progress Notes (Signed)
Family Medicine Teaching Service Daily Progress Note Intern Pager: [phone]  Patient name: Candace Ramirez Medical record number: [id number] Date of birth: 06-06-1976 Age: 43 y.o. Gender: female  Primary Care Provider: Lovenia Kim, MD Consultants: none, transferred from hospitalists Code Status: full  Pt Overview and Major Events to Date:  1/7 admitted 1/9 family medicine assumed care  Assessment and Plan: 43yo female presents with 3 days wheezing, cough, SOB, malaise. Found to be hypoxic on presentation to upper 80s. Afebrile. Wheezing improved with nebulizer but patient unable to tolerate treatments. PMHx significant for remote history of DVT, anemia, obesity.  Reactive airway disease exacerbation, CAP  Overnight on room air, patient placed on 2 L this morning.  No desaturation noted in the chart.  Patient walked with nurse yesterday and desatted to 84% on room air while ambulating.  This a.m. patient breathing comfortably on room air.  WBC this a.m. 14.1, improved from 18.3 yesterday.  She continues to remain afebrile.  States that she is feeling well and comfortable going home. -Recheck O2 sats while ambulating prior to discharge - cont azithromycin p.o. (1/8-) -DC ceftriaxone (1/8-1/10) -Transition to p.o. cefdinir (1/11- ) - cont sch atrovent and xopenex while inpatient, will send home with albuterol -Continuous pulse ox -Cardiac monitoring -S/p 24-hour methylprednisone -Continue Pred 40mg  PO (1/9- ), to complete 5-day total course -S/p IV mag sulfate 2 g in ED -Outpatient PFTs as no history of COPD or asthma  Hypokalemia: Resolved  K this a.m. 3.8. -cont to monitor BMP  Iron deficiency anemia- chronic, stable  Hemoglobin this a.m. 7.6.  Patient should have a transfusion threshold of 7 or symptomatic.  No active signs of bleeding. -continue to monitor CBC -Ferrous sulfate QOD  Tobacco use disorder Patient is a current everyday smoker. 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 no longer on anticoagulation -Receiving Lovenox while inpatient -No intervention needed at this time   FEN/GI: Heart healthy PPx: Lovenox  Disposition: Likely home today  Subjective:  Patient has no complaints this morning.  States that her breathing is very improved.  Denies any chest pain.  Objective: Temp:  [97.8 F (36.6 C)-98.3 F (36.8 C)] 98.3 F (36.8 C) (01/10 0730) Pulse Rate:  [52-100] 52 (01/10 0730) Resp:  [10-18] 18 (01/10 0730) BP: (116-135)/(44-84) 129/70 (01/10 0730) SpO2:  [92 %-100 %] 100 % (01/10 0730)  Physical Exam: General: 43 y.o. female in NAD Cardio: RRR no m/r/g Lungs: CTAB, no wheezing, no rhonchi, no crackles, no increased work of breathing, on room air Abdomen: Soft, non-tender to palpation, positive bowel sounds Skin: warm and dry Extremities: No edema    Laboratory: Recent Labs  Lab 01/02/19 0042 01/03/19 0330 01/04/19 0403  WBC 8.0 18.3* 14.1*  HGB 8.6* 8.3* 7.6*  HCT 32.3* 29.7* 28.3*  PLT 375 398 358   Recent Labs  Lab 01/02/19 0944 01/03/19 0839 01/04/19 0403  NA 138 138 139  K 3.4* 4.2 3.5  CL 108 110 107  CO2 19* 23 25  BUN <5* 9 14  CREATININE 0.95 0.88 0.91  CALCIUM 9.1 9.3 8.6*  GLUCOSE 175* 127* 90    procalcitonin neg  Imaging/Diagnostic Tests: Dg Chest 2 View  Result Date: 01/01/2019 CLINICAL DATA:  Dyspnea and cough since 11/30 last evening. EXAM: CHEST - 2 VIEW COMPARISON:  04/08/2018 FINDINGS: The heart size and mediastinal contours are within normal limits. Mild interstitial prominence compatible with chronic bronchitic change. No alveolar consolidation to suggest pneumonia. There is minimal atelectasis at the left base. No effusion or pneumothorax. No pulmonary edema.  Calcific rotator cuff tendinopathy is seen on the right. IMPRESSION: 1. Chronic bronchitic change of the lungs. 2. Calcific right sided rotator cuff tendinosis. Electronically Signed   By: Ashley Royalty M.D.   On: 01/01/2019 21:20   Ct Angio Chest Pe W And/or Wo Contrast  Result Date: 01/02/2019 CLINICAL DATA:  PE suspected, high pretest prob. Cough and shortness of breath. History of DVT. EXAM: CT ANGIOGRAPHY CHEST WITH CONTRAST TECHNIQUE: Multidetector CT imaging of the chest was performed using the standard protocol during bolus administration of intravenous contrast. Multiplanar CT image reconstructions and MIPs were obtained to evaluate the vascular anatomy. CONTRAST:  75 cc ISOVUE-370 IOPAMIDOL (ISOVUE-370) INJECTION 76% COMPARISON:  Radiographs earlier this day. FINDINGS: Cardiovascular: There are no filling defects within the pulmonary arteries to the lobar level suggest pulmonary embolus. Segmental and subsegmental branches could not be assessed due to contrast bolus timing and soft tissue attenuation from habitus. The thoracic aorta is normal in caliber without dissection. Heart is normal in size. No pericardial effusion. Mediastinum/Nodes: No enlarged mediastinal or hilar lymph nodes. No visualized thyroid nodule. Patulous esophagus is small to moderate hiatal hernia. Lungs/Pleura: Irregular anterior right upper lobe opacities suspicious for pneumonia. Anterior left upper lobe opacities as well as dependent opacities in both lower lobes to a lesser extent. Heterogeneous attenuation in the right lower lobe can be seen with small airways disease or air trapping. There is central bronchial thickening. No pleural fluid. Upper Abdomen: No acute abnormality. Musculoskeletal: There are no acute or suspicious osseous abnormalities. Review of the MIP images confirms the above findings. IMPRESSION: 1. No central pulmonary embolus. Segmental and subsegmental branches could not be assessed due to contrast bolus  timing and soft tissue attenuation from habitus. 2. Patchy opacities in the right upper lobe suspicious for pneumonia. Additional opacities in the left upper lobe and both lower lobes may be atelectasis or additional pneumonia. 3. Central bronchial thickening. Heterogeneous attenuation in the right lower lobe can be seen with small airways disease or air trapping. Electronically Signed   By: Keith Rake M.D.   On: 01/02/2019 03:27     Callender, Bernita Raisin, DO 01/04/2019, 7:44 AM PGY-1, [facility] Intern pager: [phone], text pages welcome

## 2019-01-04 NOTE — Progress Notes (Signed)
Discharge instructions read and given to pt. Pt verbalize understanding of discharge instructions. Belongings in bags at bedside. IV and telemetry removed. Pt transported out via wheelchair by NT. Son arrived to take pt home.

## 2019-01-04 NOTE — Progress Notes (Addendum)
SATURATION QUALIFICATIONS: (This note is used to comply with regulatory documentation for home oxygen)  Patient Saturations on Room Air at Rest = 95%  Patient Saturations on Room Air while Ambulating = 90%  Pt ambulated 363ft and desat to 90% on RA after arriving to room.

## 2019-01-04 NOTE — Progress Notes (Signed)
Patient requesting nicotine patches after discharge.

## 2019-01-04 NOTE — Discharge Summary (Signed)
[facility] Discharge Summary  Patient name: Candace Ramirez Medical record number: [id number] Date of birth: [birth year] Age: 43 y.o. Gender: female Date of Admission: 01/01/2019  Date of Discharge: 01/04/2019 Admitting Physician: Patrecia Pour, MD  Primary Care Provider: Lovenia Kim, MD Consultants: none  Indication for Hospitalization: ARF CAP  Discharge Diagnoses/Problem List:  CAP ARF Reactive airway disease Hypokalemia IDA Tobacco use disorder H/o menorrhagia H/o DVT with PE  Disposition: Home  Discharge Condition: Stable  Discharge Exam:  Physical Exam: General: 43 y.o. female in NAD Cardio: RRR no m/r/g Lungs: CTAB, no wheezing, no rhonchi, no crackles, no increased work of breathing, on room air Abdomen: Soft, non-tender to palpation, positive bowel sounds Skin: warm and dry Extremities: No edema   Brief Hospital Course:  43yo female who presented with 3 days wheezing, cough, SOB, malaise. Found to be hypoxic on presentation to upper 80s. Afebrile. Wheezing improved with nebulizer but patient unable to tolerate treatments. PMHx significant for remote history of DVT, anemia, obesity.  Her hospital course is outlined below.  Reactive airway disease exacerbation, CAP Patient's chest x-ray on admission revealed chronic bronchitic changes, CTA suggestive of right upper lobe, left upper lobe, and bilateral lower lobe pneumonia.  Procalcitonin was negative.  CTA ruled out PE, as patient has a history of a remote DVT.  She was noted to be wheezing and improved with nebulizer treatments.  She was treated with scheduled duo nebs, 24 hours of IV steroids that were converted to p.o. steroids, and IV ceftriaxone and azithromycin.  Patient's IV antibiotics were transitioned to p.o. azithromycin and cefdinir prior to discharge.  Patient was instructed to continue to take Orapred, cefdinir, and azithromycin to complete a 5-day course.  She was originally  placed on oxygen supplementation during her hospitalization, but was able to be weaned to room air.  O2 sats were greater than 90% at rest and while ambulating at the time of discharge.  It is likely that the patient has a diagnosis of underlying COPD, but recommend outpatient PFTs for diagnosis.  On discharge, patient given instructions to continue scheduled albuterol q4hrs for two days and then use as needed.  Tachycardia Patient was tachycardic on admission, but this improved with treatment of her overall condition and fluid resuscitation.  Anemia Patient noted to have anemia during her hospitalization.  Hemoglobin 7.6 at discharge.  Iron panel showed iron level of 15.  She was started on ferrous sulfate every other day prior to discharge.  Vital signs remained stable and she was without signs of active bleeding at the time of discharge.  She should have a repeat CBC at her follow-up appointment on Tuesday 1/14.  Tobacco use Patient is a current everyday smoker, but notes that she would like to quit.  She was sent home with a prescription for nicotine patches.  Please continue to provide patient smoking cessation support.  Issues for Follow Up:  1. Patient will need outpatient PFTs 2. Patient should have CBC at follow-up appointment, hemoglobin on discharge 7.6.  She was started on ferrous sulfate every other day prior to discharge.  Significant Procedures: None  Significant Labs and Imaging:  Recent Labs  Lab 01/02/19 0042 01/03/19 0330 01/04/19 0403  WBC 8.0 18.3* 14.1*  HGB 8.6* 8.3* 7.6*  HCT 32.3* 29.7* 28.3*  PLT 375 398 358   Recent Labs  Lab 01/02/19 0042 01/02/19 0651 01/02/19 0944 01/03/19 0839 01/04/19 0403  NA 140  --  138 138 139  K 3.2*  --  3.4* 4.2 3.5  CL 109  --  108 110 107  CO2 23  --  19* 23 25  GLUCOSE 95  --  175* 127* 90  BUN 5*  --  <5* 9 14  CREATININE 0.84  --  0.95 0.88 0.91  CALCIUM 8.7*  --  9.1 9.3 8.6*  MG  --  2.0  --   --   --     Dg  Chest 2 View  Result Date: 01/01/2019 CLINICAL DATA:  Dyspnea and cough since 11/30 last evening. EXAM: CHEST - 2 VIEW COMPARISON:  04/08/2018 FINDINGS: The heart size and mediastinal contours are within normal limits. Mild interstitial prominence compatible with chronic bronchitic change. No alveolar consolidation to suggest pneumonia. There is minimal atelectasis at the left base. No effusion or pneumothorax. No pulmonary edema. Calcific rotator cuff tendinopathy is seen on the right. IMPRESSION: 1. Chronic bronchitic change of the lungs. 2. Calcific right sided rotator cuff tendinosis. Electronically Signed   By: Ashley Royalty M.D.   On: 01/01/2019 21:20   Ct Angio Chest Pe W And/or Wo Contrast  Result Date: 01/02/2019 CLINICAL DATA:  PE suspected, high pretest prob. Cough and shortness of breath. History of DVT. EXAM: CT ANGIOGRAPHY CHEST WITH CONTRAST TECHNIQUE: Multidetector CT imaging of the chest was performed using the standard protocol during bolus administration of intravenous contrast. Multiplanar CT image reconstructions and MIPs were obtained to evaluate the vascular anatomy. CONTRAST:  75 cc ISOVUE-370 IOPAMIDOL (ISOVUE-370) INJECTION 76% COMPARISON:  Radiographs earlier this day. FINDINGS: Cardiovascular: There are no filling defects within the pulmonary arteries to the lobar level suggest pulmonary embolus. Segmental and subsegmental branches could not be assessed due to contrast bolus timing and soft tissue attenuation from habitus. The thoracic aorta is normal in caliber without dissection. Heart is normal in size. No pericardial effusion. Mediastinum/Nodes: No enlarged mediastinal or hilar lymph nodes. No visualized thyroid nodule. Patulous esophagus is small to moderate hiatal hernia. Lungs/Pleura: Irregular anterior right upper lobe opacities suspicious for pneumonia. Anterior left upper lobe opacities as well as dependent opacities in both lower lobes to a lesser extent. Heterogeneous  attenuation in the right lower lobe can be seen with small airways disease or air trapping. There is central bronchial thickening. No pleural fluid. Upper Abdomen: No acute abnormality. Musculoskeletal: There are no acute or suspicious osseous abnormalities. Review of the MIP images confirms the above findings. IMPRESSION: 1. No central pulmonary embolus. Segmental and subsegmental branches could not be assessed due to contrast bolus timing and soft tissue attenuation from habitus. 2. Patchy opacities in the right upper lobe suspicious for pneumonia. Additional opacities in the left upper lobe and both lower lobes may be atelectasis or additional pneumonia. 3. Central bronchial thickening. Heterogeneous attenuation in the right lower lobe can be seen with small airways disease or air trapping. Electronically Signed   By: Keith Rake M.D.   On: 01/02/2019 03:27   Results/Tests Pending at Time of Discharge: None  Discharge Medications:  Allergies as of 01/04/2019      Reactions   No Known Allergies       Medication List    STOP taking these medications   cephALEXin 500 MG capsule Commonly known as:  KEFLEX   diphenhydrAMINE 25 MG tablet Commonly known as:  BENADRYL   HYDROcodone-acetaminophen 5-325 MG tablet Commonly known as:  NORCO   ondansetron 4 MG tablet Commonly known as:  ZOFRAN     TAKE these  medications   albuterol 108 (90 Base) MCG/ACT inhaler Commonly known as:  PROVENTIL HFA;VENTOLIN HFA Inhale 2 puffs into the lungs every 6 (six) hours as needed for wheezing or shortness of breath. Notes to patient:  As needed   azithromycin 250 MG tablet Commonly known as:  ZITHROMAX Take 2 tablets (500 mg total) by mouth daily for 3 days.   cefdinir 300 MG capsule Commonly known as:  OMNICEF Take 1 capsule (300 mg total) by mouth every 12 (twelve) hours for 2 days. Start taking on:  January 05, 2019   ferrous sulfate 325 (65 FE) MG tablet Take 1 tablet (325 mg total) by  mouth every other day. Start taking on:  January 05, 2019   predniSONE 20 MG tablet Commonly known as:  DELTASONE Take 2 tablets (40 mg total) by mouth daily with breakfast for 2 days. Start taking on:  January 05, 2019       Discharge Instructions: Please refer to Patient Instructions section of EMR for full details.  Patient was counseled important signs and symptoms that should prompt return to medical care, changes in medications, dietary instructions, activity restrictions, and follow up appointments.   Follow-Up Appointments: Follow-up Information    Patriciaann Clan, DO. Go on 01/08/2019.   Specialty:  Family Medicine Why:  @9 :25AM (please arrive at least 64min early) Contact information: [address] [place] [address] [phone]           Cleophas Dunker, DO 01/04/2019, 4:33 PM PGY-1, [facility]

## 2019-01-04 NOTE — Plan of Care (Signed)
  Problem: Education: Goal: Knowledge of General Education information will improve Description Including pain rating scale, medication(s)/side effects and non-pharmacologic comfort measures Outcome: Progressing   Problem: Health Behavior/Discharge Planning: Goal: Ability to manage health-related needs will improve Outcome: Progressing   Problem: Clinical Measurements: Goal: Respiratory complications will improve Outcome: Progressing   Problem: Activity: Goal: Risk for activity intolerance will decrease Outcome: Progressing   Problem: Nutrition: Goal: Adequate nutrition will be maintained Outcome: Progressing   Problem: Coping: Goal: Level of anxiety will decrease Outcome: Progressing   Problem: Coping: Goal: Level of anxiety will decrease Outcome: Progressing

## 2019-01-04 NOTE — Discharge Instructions (Signed)
Continue to take azithromycin, cefdinir, and prednisone until 1/12 to complete 5 days total therapy.  Continue to take albuterol every 4 hours for the next 2 days.  Then use as needed.  Please ensure that you go to your follow-up appointment.  You should continue to take iron every other day.  You will likely get your blood checked at your appointment again as you were anemic during her hospitalization likely caused by an iron deficiency.

## 2019-01-08 ENCOUNTER — Ambulatory Visit: Payer: Medicaid Other | Admitting: Family Medicine

## 2019-01-08 NOTE — Progress Notes (Deleted)
   Subjective:    Patient ID: Candace Ramirez, female    DOB: Sep 28, 1976, 43 y.o.   MRN: [id number]   CC: hospital f/u   HPI: Candace Ramirez is a 43 year old female presenting for a hospital follow-up.  She was admitted 1/7- 1/10 for acute respiratory failure secondary to community-acquired pneumonia.  Initially required oxygen supplementation, discharged on RA and currently finishing a course of azithromycin and cefdinir.  She is also discharged with nicotine patches to help with smoking cessation.    Cbc w/ diff, send for pfts, smoking cessation.   Needs Pap smear  Smoking status reviewed  Review of Systems Per HPI, also denies recent illness, fever, headache, changes in vision, chest pain, shortness of breath, abdominal pain, N/V/D, weakness   Patient Active Problem List   Diagnosis Date Noted  . Hypoxia 01/03/2019  . Community acquired pneumonia of left upper lobe of lung ([facility])   . Acute respiratory failure with hypoxia ([facility]) 01/02/2019  . Hypokalemia 01/02/2019  . Laceration of right knee with tendon involvement 06/09/2018  . Traumatic tear of lateral meniscus of right knee 06/09/2018  . Recurrent ventral hernia 10/12/2016  . Ectopic pregnancy, tubal 07/24/2015  . Menorrhagia with regular cycle 04/03/2015  . Umbilical hernia 83/41/9622  . Left ankle swelling 08/09/2011  . DOMESTIC ABUSE 01/11/2008  . Chronic anemia 01/01/2008  . Anxiety state, unspecified 01/01/2008  . HEARTBURN 01/01/2008  . Obesity 02/22/2007  . Tobacco use 02/22/2007  . DEEP VEIN THROMBOPHLEBITIS, LEG 02/22/2007     Objective:  LMP 12/31/2018  Vitals and nursing note reviewed  General: NAD, pleasant Cardiac: RRR, normal heart sounds, no murmurs Respiratory: CTAB, normal effort Abdomen: soft, nontender, nondistended Extremities: no edema or cyanosis. WWP. Skin: warm and dry, no rashes noted Neuro: alert and oriented, no focal deficits Psych: normal affect  Assessment & Plan:    No  problem-specific Assessment & Plan notes found for this encounter.    Darrelyn Hillock, DO Family Medicine Resident PGY-1

## 2019-01-14 ENCOUNTER — Ambulatory Visit: Payer: Medicaid Other | Admitting: Family Medicine

## 2019-02-08 ENCOUNTER — Ambulatory Visit (INDEPENDENT_AMBULATORY_CARE_PROVIDER_SITE_OTHER): Payer: Self-pay | Admitting: Family Medicine

## 2019-02-08 VITALS — BP 132/80 | HR 83 | Temp 97.7°F | Wt 248.0 lb

## 2019-02-08 DIAGNOSIS — M25511 Pain in right shoulder: Secondary | ICD-10-CM | POA: Insufficient documentation

## 2019-02-08 MED ORDER — NAPROXEN 500 MG PO TABS: 500.0000 mg | ORAL_TABLET | Freq: Two times a day (BID) | ORAL | 0 refills | Status: DC

## 2019-02-08 NOTE — Assessment & Plan Note (Signed)
Acute, suspect mild strain of rotator cuff related to overuse/work duty.  Exam is largely normal however she does have some difficulty with reaching overhead secondary to pain.  -Rx: Naprosyn 500 mg twice daily x 7 to 10 days -Advised topical IcyHot or BenGay gel in addition to ice application -Encourage gentle range of motion exercises -Return precautions discussed Letter for work provided -may return on Monday 02/11/2019.

## 2019-02-08 NOTE — Patient Instructions (Signed)
It was nice seeing you again today.  You were seen in clinic for right shoulder pain which is most likely due to a pulled muscle or tendon.  As we discussed, I am prescribing you some anti-inflammatories to take twice a day for the next 7 to 10 days.  I would also recommend activity modification but continue gentle stretching and range of motion exercises as we reviewed.  You may also try ice or topical IcyHot/BenGay to alleviate your symptoms.  If you have any new or worsening symptoms, please make an appointment to be seen by a provider.  Lovenia Kim MD

## 2019-02-08 NOTE — Progress Notes (Signed)
   Subjective:   Patient ID: KEYRA VIRELLA    DOB: [birth year], 43 y.o. female   MRN: [id number]  CC: R shoulder pain  HPI: NONNA RENNINGER is a 43 y.o. female who presents to clinic today for the following issue.  R shoulder pain Duration x4 days, no history of fall or injury that she can recall.  She is left-hand dominant.  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  Does not recall lifting any heavy objects.  She states her pain is worse at night and it is difficult to find a comfortable position to sleep in.  She has tried applying ice which has helped some.  Has not tried any topical or anti-inflammatory medications. No fever, chills, nausea or vomiting.  She denies weakness however has a history of carpal tunnel and has numbness and tingling in her hands from that.  ROS: See HPI for pertinent ROS.  Social: She is a current every day smoker. Medications reviewed. Objective:   BP 132/80   Pulse 83   Temp 97.7 F (36.5 C) (Oral)   Wt 248 lb (112.5 kg)   SpO2 100%   BMI 41.27 kg/m  Vitals and nursing note reviewed.  General: 43 year old female, NAD Neck: supple CV: RRR no MRG  Lungs: CTAB, normal effort  Abdomen: soft, +bs  Skin: warm, dry, no rash Extremities: warm and well perfused  MSK: Right shoulder: Inspection reveals no abnormalities, atrophy or asymmetry. Palpation is normal with no tenderness over AC joint or bicipital groove. ROM is limited with overhead activity 2/2 pain.  Normal abduction/adduction.  Rotator cuff strength normal throughout. No signs of impingement with negative Neer and Hawkin's tests, empty can. Speeds and Yergason's tests normal. No labral pathology noted with negative Obrien's, negative clunk and good stability. Normal scapular function observed. No painful arc and no drop arm sign. No apprehension sign  Assessment & Plan:   Right shoulder pain Acute, suspect mild strain of rotator cuff  related to overuse/work duty.  Exam is largely normal however she does have some difficulty with reaching overhead secondary to pain.  -Rx: Naprosyn 500 mg twice daily x 7 to 10 days -Advised topical IcyHot or BenGay gel in addition to ice application -Encourage gentle range of motion exercises -Return precautions discussed Letter for work provided -may return on Monday 02/11/2019.  Meds ordered this encounter  Medications  . naproxen (NAPROSYN) 500 MG tablet    Sig: Take 1 tablet (500 mg total) by mouth 2 (two) times daily with a meal.    Dispense:  30 tablet    Refill:  0   Lovenia Kim, MD [facility] PGY-3

## 2019-02-15 ENCOUNTER — Other Ambulatory Visit: Payer: Medicaid Other

## 2019-03-14 ENCOUNTER — Ambulatory Visit: Payer: Medicaid Other | Admitting: Family Medicine

## 2019-06-27 ENCOUNTER — Encounter: Payer: Self-pay | Admitting: Family Medicine

## 2019-06-27 ENCOUNTER — Other Ambulatory Visit: Payer: Self-pay

## 2019-06-27 ENCOUNTER — Ambulatory Visit (INDEPENDENT_AMBULATORY_CARE_PROVIDER_SITE_OTHER): Payer: Self-pay | Admitting: Family Medicine

## 2019-06-27 ENCOUNTER — Ambulatory Visit (HOSPITAL_COMMUNITY)
Admission: RE | Admit: 2019-06-27 | Discharge: 2019-06-27 | Disposition: A | Discharge status: Home / Self Care | Payer: Self-pay | Source: Ambulatory Visit | Attending: Family Medicine | Admitting: Family Medicine

## 2019-06-27 VITALS — BP 120/80 | HR 83 | Temp 97.7°F | Wt 260.0 lb

## 2019-06-27 DIAGNOSIS — M25472 Effusion, left ankle: Secondary | ICD-10-CM

## 2019-06-27 DIAGNOSIS — D649 Anemia, unspecified: Secondary | ICD-10-CM

## 2019-06-27 DIAGNOSIS — F4321 Adjustment disorder with depressed mood: Secondary | ICD-10-CM | POA: Insufficient documentation

## 2019-06-27 NOTE — Progress Notes (Signed)
Subjective:    Patient ID: Candace Ramirez, female    DOB: 09-Feb-1976, 43 y.o.   MRN: [id number]   CC: Left ankle/leg swelling  HPI: [name] is a 43 year old female presenting discuss the following:  Left ankle/lower leg swelling: Present for the last few weeks, has increased since onset.  She does have a history of intermittent left ankle swelling after having a left foot abscess with blood clot approximately 15 years ago.  Was told to take blood thinners for 6 months at that time.  However last instance of swelling was a few years ago and has never been this severe.  Unfortunately, her younger sister passed away at the end of 2023/07/08, and patient has been stressing more with some dietary indiscretions.  Patient believes the swelling is likely related to the stress taking a toll onto her body.  She is able to still walk and get around, however cannot be on this leg for too long, feels sore.  Denies any recent injury or falls on the area.  She has not tried anything to make this better, has not been elevating the leg.  Denies any associated shortness of breath, orthopnea, difficulty breathing with activity, swelling elsewhere.  No history of CHF, cardiac abnormalities, liver disease.  She has noticed some mild darkening of her skin on the dorsal aspect of her left foot, but otherwise no rashes.  She has been active during this time, no recent surgeries or immobilization.  Is not any birth control or medications promoting clotting.  She is very concerned that this is the worst swelling she has had in this foot and that is not going down as quick as it usually does.  She would like further evaluation.  In terms of family tragedy, she feels she is coping well currently.  She has been the "strong" one for her other sisters.  She has a lot of family support, lives with her sisters.  Younger sister passed away in a car accident.  She mourns for the loss of her sister, denies any overt depressive mood, SI, or  HI.  Not interested in therapy currently, but knows this is an available option for her if she needs this.  Review of Systems Per HPI, also denies recent illness, fever, headache, changes in vision, chest pain, shortness of breath, abdominal pain, N/V/D, weakness   Patient Active Problem List   Diagnosis Date Noted  . Grief reaction 06/27/2019  . Right shoulder pain 02/08/2019  . Hypoxia 01/03/2019  . Community acquired pneumonia of left upper lobe of lung ([place])   . Acute respiratory failure with hypoxia ([place]) 01/02/2019  . Laceration of right knee with tendon involvement 06/09/2018  . Traumatic tear of lateral meniscus of right knee 06/09/2018  . Recurrent ventral hernia 10/12/2016  . Ectopic pregnancy, tubal 07/24/2015  . Menorrhagia with regular cycle 04/03/2015  . Umbilical hernia 56/43/3295  . Left ankle swelling 08/09/2011  . DOMESTIC ABUSE 01/11/2008  . Chronic anemia 01/01/2008  . Anxiety state, unspecified 01/01/2008  . HEARTBURN 01/01/2008  . Obesity 02/22/2007  . Tobacco use 02/22/2007  . DEEP VEIN THROMBOPHLEBITIS, LEG 02/22/2007     Objective:  BP 120/80   Pulse 83   Temp 97.7 F (36.5 C) (Oral)   Wt 117.9 kg   LMP 06/24/2019   SpO2 99%   BMI 43.27 kg/m  Vitals and nursing note reviewed  General: NAD, pleasant Cardiac: RRR, normal heart sounds, no murmurs Respiratory: CTAB, normal effort, satting well  on room air Abdomen: soft, nontender, nondistended Extremities: Soft tissue swelling of left foot/ankle with 1+ pitting edema to midshin on the left.  Small area of hyperpigmentation on dorsal aspect of left foot.  Full ROM, 5/5 strength in bilateral lower extremity.  Nontender with palpation of calves bilaterally, however tender when pressing on swelling on left foot.  Right foot/leg normal in appearance.  No rashes noted. Skin: warm and dry, no rashes noted Neuro: alert and oriented, no focal deficits Psych: Calm mood, affect congruent.  Tears up when  speaking about sister.  Assessment & Plan:   Left ankle swelling Acute, with previous history of off and on swelling since her ankle surgery approximately 15 years ago.  However, patient heightened concern as the swelling is more severe and has lasted longer than usual this time.  Notable unilateral swelling of left foot/ankle with 1+ edema to midshin.  While I suspect this is likely secondary to chronic changes in vasculature s/p surgery, believe it is reasonable to rule out DVT (however low likelihood) given presentation and patient concern.  Also considered CHF, however given unilaterality without a previous cardiac history or respiratory symptoms, believe this is less likely.  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  Patient does have a history of menorrhagia with her cycles.  Not currently on any hormonal therapy or iron supplementation.  - Recheck CBC   Grief reaction Younger sister passed away at the end of 2023-07-04 due to a car accident.  Grief reaction appears appropriate for situation.  Feels she is coping well currently, has good support through her sisters.  No MDD or anxiety symptoms, no SI/HI. - Provided comfort, let her know we can always provide additional resources and set her up with therapy if needed, she is not interested at this time  Discussed with patient that she was due for her Pap smear, however through further chart review see that she was HPV negative with normal cytology in 2016, would not be due until 2021 for follow-up.  Will discuss this with her when her results of CBC/U/S  return.   Follow-up within the next week if symptoms are not improving or sooner if worsening, do recommend regular follow-up with primary care provider for anemia (however pending recent CBC).   Darrelyn Hillock, DO Family Medicine Resident PGY-2

## 2019-06-27 NOTE — Progress Notes (Signed)
Left lower extremity venous duplex has been completed. Preliminary results can be found in CV Proc through chart review.  Results were faxed to Dr. Erin Hearing' office.  06/27/19 2:34 PM Candace Ramirez RVT

## 2019-06-27 NOTE — Assessment & Plan Note (Signed)
Last hemoglobin 7.6 in 12/2018, microcytic at that time.  Patient does have a history of menorrhagia with her cycles.  Not currently on any hormonal therapy or iron supplementation.  - Recheck CBC

## 2019-06-27 NOTE — Assessment & Plan Note (Addendum)
Acute, with previous history of off and on swelling since her ankle surgery approximately 15 years ago.  However, patient heightened concern as the swelling is more severe and has lasted longer than usual this time.  Notable unilateral swelling of left foot/ankle with 1+ edema to midshin.  While I suspect this is likely secondary to chronic changes in vasculature s/p surgery, believe it is reasonable to rule out DVT (however low likelihood) given presentation and patient concern.  Also considered CHF, however given unilaterality without a previous cardiac history or respiratory symptoms, believe this is less likely.  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

## 2019-06-27 NOTE — Patient Instructions (Signed)
It was a wonderful meeting you today.  I am so sorry for your family's loss, we are always here if you feel that you need additional someone to talk to you.  We have scheduled you for a ultrasound to make sure there is no clot in the leg since this is the worst that your swelling has ever been.  For your foot/leg, please try to elevate it as often as you can.  Also wrap it with an Ace bandage daily to help with the swelling.  Make sure you continue a well-balanced diet.   Please let me know if it is not improving in the next few days.

## 2019-06-27 NOTE — Assessment & Plan Note (Signed)
Younger sister passed away at the end of 07-18-2023 due to a car accident.  Grief reaction appears appropriate for situation.  Feels she is coping well currently, has good support through her sisters.  No MDD or anxiety symptoms, no SI/HI. - Provided comfort, let her know we can always provide additional resources and set her up with therapy if needed, she is not interested at this time

## 2019-06-28 LAB — CBC WITH DIFFERENTIAL/PLATELET
Basophils Absolute: 0 10*3/uL (ref 0.0–0.2)
Basos: 0 %
EOS (ABSOLUTE): 0.1 10*3/uL (ref 0.0–0.4)
Eos: 1 %
Hematocrit: 31.3 % — ABNORMAL LOW (ref 34.0–46.6)
Hemoglobin: 9 g/dL — ABNORMAL LOW (ref 11.1–15.9)
Immature Grans (Abs): 0 10*3/uL (ref 0.0–0.1)
Immature Granulocytes: 0 %
Lymphocytes Absolute: 2 10*3/uL (ref 0.7–3.1)
Lymphs: 37 %
MCH: 22.1 pg — ABNORMAL LOW (ref 26.6–33.0)
MCHC: 28.8 g/dL — ABNORMAL LOW (ref 31.5–35.7)
MCV: 77 fL — ABNORMAL LOW (ref 79–97)
Monocytes Absolute: 0.5 10*3/uL (ref 0.1–0.9)
Monocytes: 10 %
Neutrophils Absolute: 2.7 10*3/uL (ref 1.4–7.0)
Neutrophils: 52 %
Platelets: 330 10*3/uL (ref 150–450)
RBC: 4.07 x10E6/uL (ref 3.77–5.28)
RDW: 17.8 % — ABNORMAL HIGH (ref 11.7–15.4)
WBC: 5.3 10*3/uL (ref 3.4–10.8)

## 2019-06-29 ENCOUNTER — Telehealth: Payer: Self-pay | Admitting: Family Medicine

## 2019-06-29 NOTE — Telephone Encounter (Signed)
Called patient x2 (yesterday evening, 7/3, and 7/4 morning) to discuss results from recent office visit. No voicemail box set up. Will try back tomorrow or Monday.   Patriciaann Clan, DO

## 2019-07-01 ENCOUNTER — Other Ambulatory Visit: Payer: Self-pay | Admitting: Family Medicine

## 2019-07-01 ENCOUNTER — Telehealth: Payer: Self-pay

## 2019-07-01 DIAGNOSIS — D649 Anemia, unspecified: Secondary | ICD-10-CM

## 2019-07-01 MED ORDER — FERROUS SULFATE 325 (65 FE) MG PO TABS: 325.0000 mg | ORAL_TABLET | Freq: Every day | ORAL | 1 refills | Status: DC

## 2019-07-01 NOTE — Telephone Encounter (Signed)
Attempted to call patient with results.  No answer and no voicemail.  Will try again later.  Candace Ramirez, [name]

## 2019-07-02 NOTE — Telephone Encounter (Signed)
Attempted to call pt multiple times and unable to get in contact with her. Please advise. Candace Ramirez Kennon Holter, CMA

## 2019-07-02 NOTE — Telephone Encounter (Signed)
-----   Message from Patriciaann Clan, DO sent at 07/01/2019  5:32 AM EDT ----- Please let patient know she is slightly anemic (hemoglobin 9), however improved from previous check! This is likely from iron deficiency with blood loss from her heavy menstrual cycles. I have sent iron pills to her pharmacy to start taking daily. This supplementation can make her stools darker and cause GI upset/constipation.   Additionally, let her know she did NOT have any evidence of a clot in her left lower extremity. She should follow up within this week if this hasn't improved with measures discussed during our office visit. She still will need to follow up in approximately 1 month for hemoglobin recheck after iron replacement.   I appreciate your help while I am currently on night shifts. Thank you!

## 2019-07-03 ENCOUNTER — Encounter: Payer: Self-pay | Admitting: Family Medicine

## 2019-07-03 NOTE — Telephone Encounter (Signed)
Attempted to reach patient again this morning, no answer without voicemail set up. Will send letter with results.
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
## 2019-10-28 ENCOUNTER — Encounter (HOSPITAL_COMMUNITY): Payer: Self-pay | Admitting: Emergency Medicine

## 2019-10-28 ENCOUNTER — Emergency Department (HOSPITAL_COMMUNITY)
Admission: EM | Admit: 2019-10-28 | Discharge: 2019-10-28 | Disposition: A | Discharge status: Home / Self Care | Payer: Medicaid Other | Attending: Emergency Medicine | Admitting: Emergency Medicine

## 2019-10-28 ENCOUNTER — Other Ambulatory Visit: Payer: Self-pay

## 2019-10-28 ENCOUNTER — Telehealth (INDEPENDENT_AMBULATORY_CARE_PROVIDER_SITE_OTHER): Payer: Self-pay | Admitting: Family Medicine

## 2019-10-28 DIAGNOSIS — B0230 Zoster ocular disease, unspecified: Secondary | ICD-10-CM

## 2019-10-28 DIAGNOSIS — F17210 Nicotine dependence, cigarettes, uncomplicated: Secondary | ICD-10-CM | POA: Insufficient documentation

## 2019-10-28 DIAGNOSIS — H1031 Unspecified acute conjunctivitis, right eye: Secondary | ICD-10-CM | POA: Insufficient documentation

## 2019-10-28 MED ORDER — TETRACAINE HCL 0.5 % OP SOLN
1.0000 [drp] | Freq: Once | OPHTHALMIC | Status: AC
  Administered 2019-10-28: 1 [drp] via OPHTHALMIC
  Filled 2019-10-28: qty 4

## 2019-10-28 MED ORDER — FLUORESCEIN SODIUM 1 MG OP STRP
1.0000 | ORAL_STRIP | Freq: Once | OPHTHALMIC | Status: AC
  Administered 2019-10-28: 1 via OPHTHALMIC
  Filled 2019-10-28: qty 1

## 2019-10-28 MED ORDER — VALACYCLOVIR HCL 1 G PO TABS: 1000.0000 mg | ORAL_TABLET | Freq: Two times a day (BID) | ORAL | 0 refills | Status: AC

## 2019-10-28 MED ORDER — ERYTHROMYCIN 5 MG/GM OP OINT: TOPICAL_OINTMENT | OPHTHALMIC | 0 refills | Status: AC

## 2019-10-28 NOTE — Discharge Instructions (Addendum)
As we discussed it is important for you to follow-up closely with ophthalmology.  .  We are concerned that potentially this is viral in nature, I would recommend you taking the Valtrex that was prescribed along with the erythromycin I am prescribing and contact ophthalmology tomorrow to schedule your follow-up appointment.  Please return immediately if develop any new or worsening signs or symptoms.

## 2019-10-28 NOTE — ED Triage Notes (Signed)
C/o right eye draining and swelling started Thursday-- sclera reddened, had virtual visit and sent here.

## 2019-10-28 NOTE — Progress Notes (Signed)
[facility] Telemedicine Visit  Patient consented to have virtual visit. Method of visit: Video  Encounter participants: Patient: Candace Ramirez - located at home Provider: Bonnita Hollow - located at office Others (if applicable): none  Chief Complaint: Right eye swelling and pain  HPI:  Patient says that she has had right eye swelling and pain for the past 4 to 5 days.  On the video she also has a lesion over her right eye.  She says that the lesion has been there since the start of her right eye pain.  She also endorses some pain in her ear and swelling on the side of her face.  She endorses some blurry vision as well.  Patient denies having history of shingles, but has had history of chickenpox.  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  Recommend patient go immediately to the emergency department to be seen to evaluate further.  Discussed in detail how this is an ophthalmologic emergency that can lead to vision loss.  Will prescribe valacyclovir 1000 mg twice daily, may require IV acyclovir loading if found to have active zoster infection affecting her eye.  Precepted with Dr. Erin Hearing.  Time spent during visit with patient: 5 minutes

## 2019-10-28 NOTE — ED Provider Notes (Signed)
[facility] EMERGENCY DEPARTMENT Provider Note   CSN: [id number] Arrival date & time: 10/28/19  1317     History   Chief Complaint Chief Complaint  Patient presents with   Conjunctivitis    HPI Candace Ramirez is a 43 y.o. female.     HPI   43 year old female presents with a with red irritated eye.  She notes symptoms started approximately 4 days ago with redness and minor swelling to the right eye.  She notes very minimal discomfort.  No significant changes in her vision, she does note initial light sensitivity when going outside but this resolves, none presently.  She notes some puffiness in the upper and lower eyelids with some scabbing to the upper and lower eyelids.  She is uncertain if she had chickenpox as a kid no history of shingles.  She was seen through video visit and referred to the emergency room for further evaluation.    Past Medical History:  Diagnosis Date   Anemia    DVT (deep venous thrombosis) ([place]) 2003   also had an infection in left ankle at same time; not pregnant (lovenox/coumadin)   Fibroid    Hiatal hernia    Laceration of right knee with tendon involvement 06/09/2018   Obesity    Traumatic tear of lateral meniscus of right knee 06/09/2018    Patient Active Problem List   Diagnosis Date Noted   Grief reaction 06/27/2019   Right shoulder pain 02/08/2019   Hypoxia 01/03/2019   Community acquired pneumonia of left upper lobe of lung    Acute respiratory failure with hypoxia ([place]) 01/02/2019   Laceration of right knee with tendon involvement 06/09/2018   Traumatic tear of lateral meniscus of right knee 06/09/2018   Recurrent ventral hernia 10/12/2016   Ectopic pregnancy, tubal 07/24/2015   Menorrhagia with regular cycle [id number]   Umbilical hernia [id number]   Left ankle swelling 08/09/2011   DOMESTIC ABUSE 01/11/2008   Chronic anemia 01/01/2008   Anxiety state, unspecified 01/01/2008   HEARTBURN  01/01/2008   Obesity 02/22/2007   Tobacco use 02/22/2007   DEEP VEIN THROMBOPHLEBITIS, LEG 02/22/2007    Past Surgical History:  Procedure Laterality Date   DIAGNOSTIC LAPAROSCOPY WITH REMOVAL OF ECTOPIC PREGNANCY N/A 07/24/2015   Procedure: DIAGNOSTIC LAPAROSCOPY WITH REMOVAL OF ECTOPIC PREGNANCY;  Surgeon: Donnamae Jude, MD;  Location: [facility];  Service: Gynecology;  Laterality: N/A;   DILATION AND CURETTAGE OF UTERUS     HERNIA REPAIR     HIATAL HERNIA REPAIR     I&D EXTREMITY Right 06/09/2018   Procedure: IRRIGATION AND DEBRIDEMENT RIGHT KNEE WITH LATERAL MENISCUS REPAIR AND COMPLEX 12CM. LACERATION OF I.Q. BAND REPAIR.;  Surgeon: Marchia Bond, MD;  Location: [facility];  Service: Orthopedics;  Laterality: Right;   INSERTION OF MESH N/A 10/12/2016   Procedure: INSERTION OF MESH;  Surgeon: Autumn Messing III, MD;  Location: [facility];  Service: General;  Laterality: N/A;   LAPAROSCOPIC INCISIONAL / UMBILICAL / [place]  10/12/2016   [place] w/mesh/notes 10/12/2016   NO PAST SURGERIES     VENTRAL HERNIA REPAIR N/A 10/12/2016   Procedure: LAPAROSCOPIC VENTRAL HERNIA;  Surgeon: Autumn Messing III, MD;  Location: [facility];  Service: General;  Laterality: N/A;     OB History    Gravida  13   Para  5   Term  4   Preterm  1   AB  6   Living  5  SAB      TAB      Ectopic      Multiple      Live Births               Home Medications    Prior to Admission medications   Medication Sig Start Date End Date Taking? Authorizing Provider  albuterol (PROVENTIL HFA;VENTOLIN HFA) 108 (90 Base) MCG/ACT inhaler Inhale 2 puffs into the lungs every 6 (six) hours as needed for wheezing or shortness of breath. 01/04/19  Yes Meccariello, Bernita Raisin, DO  erythromycin ophthalmic ointment Place a 1/2 inch ribbon of ointment into the lower eyelid 5 times daily for 7 days 10/28/19   Leamon Palau, Dellis Filbert, PA-C  valACYclovir (VALTREX) 1000 MG tablet Take 1 tablet (1,000 mg total) by mouth 2  (two) times daily. 10/28/19   Bonnita Hollow, MD    Family History Family History  Problem Relation Age of Onset   COPD Mother    Hypertension Mother    Drug abuse Father        died of drug overdose    Social History Social History   Tobacco Use   Smoking status: Current Every Day Smoker    Packs/day: 1.00    Types: Cigarettes   Smokeless tobacco: Never Used  Substance Use Topics   Alcohol use: Not Currently    Alcohol/week: 0.0 standard drinks    Comment: socially   Drug use: Yes    Types: Marijuana    Comment: smokes once a week     Allergies   Patient has no known allergies.   Review of Systems Review of Systems  All other systems reviewed and are negative.   Physical Exam Updated Vital Signs BP 140/90    Pulse 80    Temp 98.4 F (36.9 C) (Oral)    Resp 18    LMP 10/25/2019 (Exact Date)    SpO2 99%   Physical Exam Vitals signs and nursing note reviewed.  Constitutional:      Appearance: She is well-developed.  HENT:     Head: Normocephalic and atraumatic.  Eyes:     General: No scleral icterus.       Right eye: No discharge.        Left eye: No discharge.     Conjunctiva/sclera: Conjunctivae normal.     Pupils: Pupils are equal, round, and reactive to light.     Comments: Right eye with palpebral and conjunctival injection minor watery discharge -scabbed lesions noted to the right upper and lower eyelid no vesicular lesions-minor erythema noted to the right upper and lower eyelids-pupils equal round reactive to light, extraocular movements intact and pain-free, vision equal bilateral, no obvious corneal lesions, no fluorescein uptake  Neck:     Musculoskeletal: Normal range of motion.     Vascular: No JVD.     Trachea: No tracheal deviation.  Pulmonary:     Effort: Pulmonary effort is normal.     Breath sounds: No stridor.  Neurological:     Mental Status: She is alert and oriented to person, place, and time.     Coordination:  Coordination normal.  Psychiatric:        Behavior: Behavior normal.        Thought Content: Thought content normal.        Judgment: Judgment normal.      ED Treatments / Results  Labs (all labs ordered are listed, but only abnormal results are displayed) Labs Reviewed - No  data to display  EKG None  Radiology No results found.  Procedures Procedures (including critical care time)  Medications Ordered in ED Medications  fluorescein ophthalmic strip 1 strip (1 strip Right Eye Given 10/28/19 1630)  tetracaine (PONTOCAINE) 0.5 % ophthalmic solution 1 drop (1 drop Right Eye Given 10/28/19 1630)     Initial Impression / Assessment and Plan / ED Course  I have reviewed the triage vital signs and the nursing notes.  Pertinent labs & imaging results that were available during my care of the patient were reviewed by me and considered in my medical decision making (see chart for details).        43 year old female presents today with conjunctivitis with questionable herpes ophthalmicus.  Patient does have scabbing noted to her upper and lower eyelids, no vesicular lesions noted.  She has no dendritic lesions or significant visual changes.  Patient will be treated with Valtrex and erythromycin.  I discussed the case with on-call ophthalmologist Dr. Monica Martinez who will see the patient Wednesday morning in his office at 8 AM.  Patient will return immediately she develops any new or worsening signs or symptoms.  She verbalized understanding and agreement to today's plan had no further questions or concerns at time of discharge. Final Clinical Impressions(s) / ED Diagnoses   Final diagnoses:  Acute bacterial conjunctivitis of right eye    ED Discharge Orders         Ordered    erythromycin ophthalmic ointment     10/28/19 1815           Okey Regal, PA-C 10/29/19 Iuka, Ankit, MD 11/05/19 0745

## 2019-10-28 NOTE — ED Notes (Signed)
Patient verbalizes understanding of discharge instructions. Opportunity for questioning and answers were provided. Armband removed by staff, pt discharged from ED.  

## 2019-11-04 ENCOUNTER — Other Ambulatory Visit: Payer: Self-pay | Admitting: Family Medicine

## 2019-11-08 ENCOUNTER — Telehealth: Payer: Self-pay | Admitting: Family Medicine

## 2019-11-08 NOTE — Telephone Encounter (Signed)
Letter sent to inform patient to make appointment for Pap-smear and annual visit.

## 2019-12-17 ENCOUNTER — Encounter: Payer: Self-pay | Admitting: Family Medicine

## 2019-12-17 ENCOUNTER — Ambulatory Visit (INDEPENDENT_AMBULATORY_CARE_PROVIDER_SITE_OTHER): Payer: Self-pay | Admitting: Family Medicine

## 2019-12-17 ENCOUNTER — Other Ambulatory Visit: Payer: Self-pay

## 2019-12-17 VITALS — BP 121/80 | HR 92 | Wt 266.8 lb

## 2019-12-17 DIAGNOSIS — S8012XA Contusion of left lower leg, initial encounter: Secondary | ICD-10-CM

## 2019-12-17 MED ORDER — NAPROXEN 500 MG PO TABS: 500.0000 mg | ORAL_TABLET | Freq: Two times a day (BID) | ORAL | 0 refills | Status: DC

## 2019-12-17 NOTE — Patient Instructions (Signed)
I believe your leg pain is from a hematoma (bruise) of your muscle. I have sent in in some Naproxen for your pain. You can take this medicine up to twice a day. You may also apply heat and ice to the area 3-4 times a day. I have scheduled you for a follow up telemedicine visit for Monday 12/28. Please expect a call from our clinic to check in. Please go to the ED or make follow up appointment immediately if pain worsens, redness spreads up leg, develop fever/chills.   Take care and I hope you have a great holiday season! Dr. Tarry Kos

## 2019-12-17 NOTE — Progress Notes (Signed)
   Subjective:   Patient ID: Candace Ramirez    DOB: Mar 03, 1976, 43 y.o. female   MRN: [id number]  Candace Ramirez is a 43 y.o. female with a history of DVT in 2003 here for left leg pain.  Left leg pain: Patient notes she bumped her medial left lower leg x 2-3 weeks ago. Since the injury the area has been sore but in the last 2-3 days a dark area appeared and the area surrounding it has become more red. She notes it is now very painful. She denies any skin damage when she bumped her leg. Denies any fevers or chills. Denies any lower extremity swelling. She has a history of cellulitis and abscess of this same leg in 2003 that subsequently developed a DVT.   Health Maintenance: Due for Flu vaccine and Pap-smear  Social History: LMP was 12/17  Review of Systems:  Per HPI.   [place], medications and smoking status reviewed.  Objective:   BP 121/80   Pulse 92   Wt 266 lb 12.8 oz (121 kg)   SpO2 100%   BMI 44.40 kg/m  Vitals and nursing note reviewed.  General: well appearing, pleasant lady, sitting comfortably in exam chair, well nourished, well developed, in no acute distress with non-toxic appearance Resp: breathing comfortably on room air, speaking in full sentences, unlabored breathing Skin: warm, dry, circumferential erythematous area with minimally increased warmth with central hyperpigmented nodule. No signs of open wound. No discharge or bleeding.  Extremities: warm and well perfused, Medial left lower extremity very tender to palpation, <2 sec cap refill bilaterally, no edema bilaterally, 2+ dorsalis pedis pulses bilaterally MSK:  gait normal Neuro: Alert and oriented, speech normal        Assessment & Plan:   Hematoma of left lower leg Acute traumatic erythematous area with central hyperpigmented nodule on medial left lower extremity. Differential at this time includes hematoma vs traumatic panniculitis. Cellulitis was considered however given lack of extension of  erythema and significant warmth, makes the less likely. No LE edema concerning for DVT at this time. Discussed management options including conservative treatment vs biopsy. Opted to treat conservatively with Naproxen 500mg  BID with close follow up to evaluate progression. If appears to be worsening then must consider biopsy vs antibiotics at that time. Strict return precautions discussed including severe pain, extension of erythema, development of fevers or chills. She understood and agreed to plan. Follow up scheduled for 12/23/19.   No orders of the defined types were placed in this encounter.  Meds ordered this encounter  Medications  . naproxen (NAPROSYN) 500 MG tablet    Sig: Take 1 tablet (500 mg total) by mouth 2 (two) times daily with a meal.    Dispense:  30 tablet    Refill:  0   Mina Marble, DO PGY-2, [facility] 12/18/2019 9:03 PM

## 2019-12-18 ENCOUNTER — Encounter: Payer: Self-pay | Admitting: Family Medicine

## 2019-12-18 DIAGNOSIS — S8012XA Contusion of left lower leg, initial encounter: Secondary | ICD-10-CM | POA: Insufficient documentation

## 2019-12-18 NOTE — Assessment & Plan Note (Addendum)
Acute traumatic erythematous area with central hyperpigmented nodule on medial left lower extremity. Differential at this time includes hematoma vs traumatic panniculitis. Cellulitis was considered however given lack of extension of erythema and significant warmth, makes the less likely. No LE edema concerning for DVT at this time. Discussed management options including conservative treatment vs biopsy. Opted to treat conservatively with Naproxen 500mg  BID, alternating heat and ice to the area 3-4 times a day, with close follow up to evaluate progression. If appears to be worsening then must consider biopsy vs antibiotics at that time. Strict return precautions discussed including severe pain, extension of erythema, development of fevers or chills. She understood and agreed to plan. Follow up scheduled for 12/23/19.

## 2019-12-23 ENCOUNTER — Telehealth (INDEPENDENT_AMBULATORY_CARE_PROVIDER_SITE_OTHER): Payer: Self-pay | Admitting: Family Medicine

## 2019-12-23 ENCOUNTER — Other Ambulatory Visit: Payer: Self-pay

## 2019-12-23 DIAGNOSIS — S81802D Unspecified open wound, left lower leg, subsequent encounter: Secondary | ICD-10-CM

## 2019-12-23 NOTE — Progress Notes (Signed)
[place] Telemedicine Visit  Patient consented to have virtual visit. Method of visit: Video  Encounter participants: Patient: Candace Ramirez - located at [employer] Provider: Danna Hefty - located at [facility] Others (if applicable): None  Chief Complaint: Follow up left leg  HPI: Patient is scheduled for telemedicine visit today to follow up left leg bump. She notes it hasn't really improved. It is still very painful. It developed a blister which then popped and caused some of her skin to peel off. When the blister popped it was clear/yellow in color with small amount of blood.  She notes her whole leg from the knee down is swollen. Notes the redness isn't worsening. Denies any fevers/chills, SOB, chest pain, cough.   ROS: per HPI  Pertinent PMHx: H/o DVT in 2003 (provoked)  Exam:  Respiratory: Breathing comfortably on room air,  MSK: calf non-tender to palpation, calf sizes appear symmetric per patient Skin: open wound on lateral left LE with some surrounding erythema, however does appear to be stable without extension. Recent black nodule now an open sore, however no obvious drainage however camera difficult to assess  Assessment/Plan:  Open wound of left lower leg Does not appear to be improving, however does not appear to be worsening either. Very difficult to assess on camera. Given symmetric calf sizes and nontender to palpation, DVT still low on differential. Do not thing antibiotics are warranted at this time. Patient scheduled for in-person follow up on 01/02/20 to further assess. Recommended cleaning daily with mild soap and water, new bandage daily, elevation of left LE to help with swelling. Strict return precautions discussed including worsening redness/swelling, calf pain, SOB/chest pain, development of fevers/chills.  - consider biopsy vs antibiotic coverage if worsening  - consider d-dimer or LE doppler if significant asymmetric swelling is appreciated   - can consider [place] antimicrobial soaks if inidcated    Time spent during visit with patient: 21 minutes  Mina Marble, DO [place], PGY2 12/23/19

## 2019-12-27 DIAGNOSIS — S81802A Unspecified open wound, left lower leg, initial encounter: Secondary | ICD-10-CM | POA: Insufficient documentation

## 2019-12-27 NOTE — Assessment & Plan Note (Signed)
Does not appear to be improving, however does not appear to be worsening either. Very difficult to assess on camera. Given symmetric calf sizes and nontender to palpation, DVT still low on differential. Do not thing antibiotics are warranted at this time. Patient scheduled for in-person follow up on 01/02/20 to further assess. Recommended cleaning daily with mild soap and water, new bandage daily, elevation of left LE to help with swelling. Strict return precautions discussed including worsening redness/swelling, calf pain, SOB/chest pain, development of fevers/chills.  - consider biopsy vs antibiotic coverage if worsening  - consider d-dimer or LE doppler if significant asymmetric swelling is appreciated  - can consider [place] antimicrobial soaks if inidcated

## 2019-12-30 ENCOUNTER — Ambulatory Visit (INDEPENDENT_AMBULATORY_CARE_PROVIDER_SITE_OTHER): Payer: Self-pay | Admitting: Family Medicine

## 2019-12-30 ENCOUNTER — Other Ambulatory Visit: Payer: Self-pay

## 2019-12-30 DIAGNOSIS — S81802D Unspecified open wound, left lower leg, subsequent encounter: Secondary | ICD-10-CM

## 2019-12-30 MED ORDER — SULFAMETHOXAZOLE-TRIMETHOPRIM 800-160 MG PO TABS: 1.0000 | ORAL_TABLET | Freq: Two times a day (BID) | ORAL | 0 refills | Status: DC

## 2019-12-30 NOTE — Patient Instructions (Signed)
It was great meeting you today!  I am sorry about your leg.  I am glad that the pain has improved but I am a little bit concerned about that wound.  I would like to see you back either on Friday or next Monday to recheck that wound.  Given that you had pus and blood coming out of it I think starting antibiotic is a good choice.  We will do Bactrim double strength 2 times a day for 7 days.  I showed you how to properly dressed this wound, please do this 3 or 4 times per day with the Vaseline and Kerlix like I showed you.

## 2019-12-31 ENCOUNTER — Other Ambulatory Visit: Payer: Self-pay | Admitting: Family Medicine

## 2019-12-31 DIAGNOSIS — S81802D Unspecified open wound, left lower leg, subsequent encounter: Secondary | ICD-10-CM

## 2019-12-31 MED ORDER — SILVER SULFADIAZINE 1 % EX CREA: 1.0000 "application " | TOPICAL_CREAM | Freq: Every day | CUTANEOUS | 2 refills | Status: AC

## 2019-12-31 MED ORDER — SILVER SULFADIAZINE 1 % EX CREA: 1.0000 "application " | TOPICAL_CREAM | Freq: Every day | CUTANEOUS | 0 refills | Status: DC

## 2019-12-31 NOTE — Progress Notes (Signed)
   HPI 44 year old female who presents for left lower extremity wound.  She was initially seen on 12/17/2019 after she banged her leg into a table.  She developed an area of hyperpigmentation, induration with central clearing.  She was seen as a telemedicine visit on 12/23/2019.  She was felt to have developed ecchymoses from the trauma of knocking her leg into a table.  Of note the patient has numerous well-healing excoriations on bilateral lower extremity.  When asked about this the patient says that she picks at the skin on her legs due to a nervous tic.  From her wound she has been able to express a mixture of pus and blood a few days prior to this clinic visit.  She feels that the pain has improved a good amount and it has "dried out".  She has developed a significant black eschar in the middle of her wound.  CC: Left lower extremity wound   ROS:   Review of Systems See HPI for ROS.   CC, SH/smoking status, and VS noted  Objective: BP 122/80   Pulse 89   Wt 268 lb 12.8 oz (121.9 kg)   SpO2 99%   BMI 44.73 kg/m  Gen: 44 year old African-American female, no acute distress, resting comfortably CV: Skin warm and dry Resp: No accessory muscle use Neuro: Alert and oriented, Speech clear, No gross deficits Left lower extremity: Large approximately 4 cm black eschar with surrounding induration and erythema in a circular pattern.  Does not feel hot.  Mild amount of edematous bruising on palpation of black eschar.  Exquisitely painful in area of black central clearing and surrounding erythematous area.     Assessment and plan:  Open wound of left lower leg Given the express purulent material in the dramatic worsening of the wound will treat as cellulitis.  Will provide MRSA coverage with Bactrim DS twice daily for 7 days.  Concerningly the wound appears to be much worse than at last telemedicine visit.  Would not do wet-to-dry in this wound.  Can liberally apply Vaseline 3-4 times a day  with Kerlix overlying Vaseline, with Kerlix roll applied to keep it stable.  Would like to see patient back either 1/8 or 1/11 to see how this is progressed.  Fortunately this black eschar area is not particularly deep, but is at high risk of tissue death in this area.  Could consider a wound care referral if not improved on follow-up. -Start Bactrim DS twice daily for 7 days -Follow-up in 4 to 7 days for wound check -Consider wound care center referral if not improved at checkup.   No orders of the defined types were placed in this encounter.   Meds ordered this encounter  Medications  . sulfamethoxazole-trimethoprim (BACTRIM DS) 800-160 MG tablet    Sig: Take 1 tablet by mouth 2 (two) times daily.    Dispense:  20 tablet    Refill:  0     Guadalupe Dawn MD PGY-3 Family Medicine Resident  12/31/2019 1:38 PM

## 2019-12-31 NOTE — Assessment & Plan Note (Signed)
Given the express purulent material in the dramatic worsening of the wound will treat as cellulitis.  Will provide MRSA coverage with Bactrim DS twice daily for 7 days.  Concerningly the wound appears to be much worse than at last telemedicine visit.  Would not do wet-to-dry in this wound.  Can liberally apply Vaseline 3-4 times a day with Kerlix overlying Vaseline, with Kerlix roll applied to keep it stable.  Would like to see patient back either 1/8 or 1/11 to see how this is progressed.  Fortunately this black eschar area is not particularly deep, but is at high risk of tissue death in this area.  Could consider a wound care referral if not improved on follow-up. -Start Bactrim DS twice daily for 7 days -Follow-up in 4 to 7 days for wound check -Consider wound care center referral if not improved at checkup.

## 2019-12-31 NOTE — Progress Notes (Signed)
Called patient to check in after appointment yesterday. She notes she is doing well. She has not been able to pick up antibiotics yet. She notes her leg is really hurting her. Plans to pick up rx in the AM. Will call in Silvadene cream for her to apply to area daily as well. Plan to follow up on 1/13, sooner if develops fever/chills or if wound appears to be worsening.

## 2020-01-08 ENCOUNTER — Encounter: Payer: Self-pay | Admitting: Family Medicine

## 2020-01-08 ENCOUNTER — Other Ambulatory Visit: Payer: Self-pay

## 2020-01-08 ENCOUNTER — Ambulatory Visit (INDEPENDENT_AMBULATORY_CARE_PROVIDER_SITE_OTHER): Payer: Self-pay | Admitting: Family Medicine

## 2020-01-08 VITALS — BP 125/80 | HR 82 | Wt 270.0 lb

## 2020-01-08 DIAGNOSIS — S81802D Unspecified open wound, left lower leg, subsequent encounter: Secondary | ICD-10-CM

## 2020-01-08 MED ORDER — SULFAMETHOXAZOLE-TRIMETHOPRIM 800-160 MG PO TABS: 1.0000 | ORAL_TABLET | Freq: Two times a day (BID) | ORAL | 0 refills | Status: AC

## 2020-01-08 NOTE — Patient Instructions (Signed)
Please continue the act Bactrim antibiotic twice a day. I have called in another prescription for you to take for an additional 2 weeks.   I have bandaged the wound. Try to avoid getting the wound wet. Try to elevate your legs as often as possible and try to limit your salt intake to help with the swelling.  I also recommend you take a Multivitamin twice a day to help aid in the healing.  Please stop taking the silvadene cream at this time.

## 2020-01-08 NOTE — Progress Notes (Signed)
   Subjective:   Patient ID: Candace Ramirez    DOB: November 21, 1976, 44 y.o. female   MRN: [id number]  Candace Ramirez is a 44 y.o. female with a history of chronic anemia, obesity, and tobacco use  here for follow up of left lower leg wound.  Follow up left leg wound: Patient seen on 12/30/19 by Dr. Kris Mouton for follow up of LE wound. The wound had progressively worsened with development of black eschar and purulent material. She was started on 7 day course of Bactrim. She notes she has a few days left. She has also been applying Silvadene cream daily. She notes the area is not as painful but there are times of more pain. Denies any fevers or chills. Notes some light brown discharge occasionally but no bleeding. She notes her LE swelling or erythema around wound is not worsening.   Review of Systems:  Per HPI.   [place], medications and smoking status reviewed.  Objective:   BP 125/80   Pulse 82   Wt 270 lb (122.5 kg)   SpO2 100%   BMI 44.93 kg/m  Vitals and nursing note reviewed.  General: pleasant middle aged AA female, sitting comfortably in exam chair, well nourished, well developed, in no acute distress with non-toxic appearance Resp: Breathing comfortably on room air, speaking in full sentences Skin: Erythema with trailing scale surrounding a central ulcer with pink borders, no drainage or bleeding, no increased warmth, erythema is stable without signs of extension compared to prior pictures Extremities: warm and well perfused, trace LE edema bilaterally, 2+ pedal pulses bilaterally  MSK: gait normal      Assessment & Plan:   Open wound of left lower leg Left lower extremity wound significantly worse since initial presentation with development of large ulcer. Does appear to be stable since last visit on 12/30/19 although black eschar has fallen off. Patient is currently finishing 7 day course of Bactrim DS with application of daily Silvadene. Although presence of large ulcer, no  increased warmth, discharge, or extension of erythema which is reassuring that infection is being adequately treated. Ddx at this time includes community acquired MRSA infection. HIV in Jan 2020 negative. No history of diabetes. Will extend antibiotic treatment for another 14 days. Will stop Silvadene cream as wound appears too moist. Will apply Calcium alginate and replicare bandage. Patient to return on 1/15 for bandage change. Recommended BID Multivitamin to aid in healing and smoking cessation. Return precautions discussed including worsening pain, erythema, discharge or development of fevers or chills.  Meds ordered this encounter  Medications  . sulfamethoxazole-trimethoprim (BACTRIM DS) 800-160 MG tablet    Sig: Take 1 tablet by mouth 2 (two) times daily for 14 days.    Dispense:  28 tablet    Refill:  0   Mina Marble, DO PGY-2, [facility] 01/09/2020 11:24 PM

## 2020-01-09 NOTE — Assessment & Plan Note (Signed)
Left lower extremity wound significantly worse since initial presentation with development of large ulcer. Does appear to be stable since last visit on 12/30/19 although black eschar has fallen off. Patient is currently finishing 7 day course of Bactrim DS with application of daily Silvadene. Although presence of large ulcer, no increased warmth, discharge, or extension of erythema which is reassuring that infection is being adequately treated. Ddx at this time includes community acquired MRSA infection. HIV in Jan 2020 negative. No history of diabetes. Will extend antibiotic treatment for another 14 days. Will stop Silvadene cream as wound appears too moist. Will apply Calcium alginate and replicare bandage. Patient to return on 1/15 for bandage change. Recommended BID Multivitamin to aid in healing and smoking cessation. Return precautions discussed including worsening pain, erythema, discharge or development of fevers or chills.

## 2020-01-10 ENCOUNTER — Ambulatory Visit (INDEPENDENT_AMBULATORY_CARE_PROVIDER_SITE_OTHER): Payer: Self-pay | Admitting: Family Medicine

## 2020-01-10 ENCOUNTER — Other Ambulatory Visit: Payer: Self-pay

## 2020-01-10 DIAGNOSIS — S81802D Unspecified open wound, left lower leg, subsequent encounter: Secondary | ICD-10-CM

## 2020-01-10 NOTE — Progress Notes (Signed)
   Subjective:   Patient ID: Candace Ramirez    DOB: [birth year], 44 y.o. female   MRN: [id number]  Candace Ramirez is a 45 y.o. female  here for wound recheck.  Left lower extremity wound: Patient here for follow up of left lower extremity wound. She was last seen on 1/13 where she had calcium alginate dressing placed. Her Bactrim was extended for an additional 2 weeks due to it's rapid progression and concern for community acquired MRSA. She denies any acute concerns today. Notes pain is intermittent. No fevers or chills.   Review of Systems:  Per HPI.   [place], medications and smoking status reviewed.  Objective:   BP 140/68   Pulse (!) 106   LMP 01/06/2020 (Exact Date)   SpO2 98%   Breastfeeding No  Vitals and nursing note reviewed.  General: pleasant middle aged AA female, sitting comfortably in exam chair, well nourished, well developed, in no acute distress with non-toxic appearance Resp: Breathing comfortably on room air, speaking in full sentences Skin: warm, dry, pink granulomatous tissue throughout wound with no further necrotic tissue present, minimal erythema and no increased warmth, minimal discharge on bandage Extremities: warm and well perfused, trace edema in left LE > right LE MSK: gait normal      Assessment & Plan:   Open wound of left lower leg Overall, appears to be improving with pink granulation tissue throughout. Wound does not appear to be enlarging. No significant drainage. Will re-apply calcium alginate and replicare bandage given improvement in wound. Continue Bactrim as prescribed. Will have patient follow up on Monday for bandage change. Continued to encourage smoking cessation. Return precautions discussed including worsening pain, erythema, discharge, or development of fevers or chills.   Mina Marble, DO PGY-2, [facility] 01/10/2020 3:59 PM

## 2020-01-10 NOTE — Assessment & Plan Note (Signed)
Overall, appears to be improving with pink granulation tissue throughout. Wound does not appear to be enlarging. No significant drainage. Will re-apply calcium alginate and replicare bandage given improvement in wound. Continue Bactrim as prescribed. Will have patient follow up on Monday for bandage change. Continued to encourage smoking cessation. Return precautions discussed including worsening pain, erythema, discharge, or development of fevers or chills.

## 2020-01-13 ENCOUNTER — Ambulatory Visit: Payer: Medicaid Other | Admitting: Family Medicine

## 2020-01-15 ENCOUNTER — Ambulatory Visit (INDEPENDENT_AMBULATORY_CARE_PROVIDER_SITE_OTHER): Payer: Self-pay | Admitting: Family Medicine

## 2020-01-15 ENCOUNTER — Ambulatory Visit: Payer: Medicaid Other

## 2020-01-15 ENCOUNTER — Other Ambulatory Visit: Payer: Self-pay

## 2020-01-15 VITALS — BP 130/80 | HR 93 | Wt 263.8 lb

## 2020-01-15 DIAGNOSIS — S8012XA Contusion of left lower leg, initial encounter: Secondary | ICD-10-CM

## 2020-01-15 DIAGNOSIS — S81802D Unspecified open wound, left lower leg, subsequent encounter: Secondary | ICD-10-CM

## 2020-01-15 MED ORDER — NAPROXEN 500 MG PO TABS: 500.0000 mg | ORAL_TABLET | Freq: Two times a day (BID) | ORAL | 0 refills | Status: AC

## 2020-01-15 NOTE — Patient Instructions (Signed)
It was great to see you!  Our plans for today:  - Your leg looks so much better!! - After you finish this current course of antibiotics, you can stop. You do not have to pick up the new prescription from the pharmacy. - After a few days you can take off the dressing. Keep it covered with vaseline and gauze pad until healed. - continue naproxen as needed for pain control.  - Come back in 1 week for a wound check.  Take care and seek immediate care sooner if you develop any concerns.   Dr. [facility]

## 2020-01-15 NOTE — Progress Notes (Signed)
  Subjective:   Patient ID: Candace Ramirez    DOB: March 25, 1976, 44 y.o. female   MRN: [id number]  Candace Ramirez is a 44 y.o. female with a history of obesity, chronic anemia, tobacco use here for   Left lower extremity wound - Seen previously 1/13 and 1/15 with interval improvement with bactrim and calcium alginate dressings.  - keeping covered - finished antibiotics today. Does have prescription for additional bactrim for additional 2 weeks.  - Denies fevers - pain controlled with naproxen.  Review of Systems:  Per HPI.  Medications and smoking status reviewed.  Objective:   BP 130/80   Pulse 93   Wt 263 lb 12.8 oz (119.7 kg)   LMP 01/06/2020 (Exact Date)   SpO2 99%   BMI 43.90 kg/m  Vitals and nursing note reviewed.  General: overweight female, in no acute distress with non-toxic appearance Skin: warm, dry. ~2cm circumferential wound with slough overlying granulation tissue. No tunneling, surrounding redness, swelling, or warmth.  Assessment & Plan:   Open wound of left lower leg Well healing with oral antibiotics and calcium alginate dressings. Wet debridement of wound base performed with wet gauze today followed by reapplication of alginate dressing. Instructed patient to remove in 2-3 days and follow with vaseline and cover with gauze. No need for further antibiotics. F/u in one week for wound check. RTC sooner if worsens or develops fevers, redness, swelling.  No orders of the defined types were placed in this encounter.  Meds ordered this encounter  Medications  . naproxen (NAPROSYN) 500 MG tablet    Sig: Take 1 tablet (500 mg total) by mouth 2 (two) times daily with a meal.    Dispense:  30 tablet    Refill:  0    Rory Percy, DO PGY-3, [facility] 01/15/2020 5:07 PM

## 2020-01-15 NOTE — Assessment & Plan Note (Signed)
Well healing with oral antibiotics and calcium alginate dressings. Wet debridement of wound base performed with wet gauze today followed by reapplication of alginate dressing. Instructed patient to remove in 2-3 days and follow with vaseline and cover with gauze. No need for further antibiotics. F/u in one week for wound check. RTC sooner if worsens or develops fevers, redness, swelling.

## 2020-10-22 ENCOUNTER — Ambulatory Visit: Payer: Medicaid Other

## 2020-10-26 ENCOUNTER — Ambulatory Visit: Payer: Medicaid Other | Admitting: Family Medicine

## 2020-10-26 NOTE — Progress Notes (Deleted)
    SUBJECTIVE:   CHIEF COMPLAINT / HPI:   R wrist pain  Leg knots:   Leg wound:   PERTINENT  PMH / PSH: ***  OBJECTIVE:   There were no vitals taken for this visit.  ***  ASSESSMENT/PLAN:   No problem-specific Assessment & Plan notes found for this encounter.     Benay Pike, MD [place]

## 2021-08-23 ENCOUNTER — Ambulatory Visit (INDEPENDENT_AMBULATORY_CARE_PROVIDER_SITE_OTHER): Payer: Self-pay | Admitting: Student

## 2021-08-23 ENCOUNTER — Other Ambulatory Visit: Payer: Self-pay

## 2021-08-23 ENCOUNTER — Encounter: Payer: Self-pay | Admitting: Student

## 2021-08-23 ENCOUNTER — Other Ambulatory Visit (HOSPITAL_COMMUNITY)
Admission: RE | Admit: 2021-08-23 | Discharge: 2021-08-23 | Disposition: A | Discharge status: Home / Self Care | Payer: Medicaid Other | Source: Ambulatory Visit | Attending: Family Medicine | Admitting: Family Medicine

## 2021-08-23 VITALS — BP 130/70 | HR 96 | Wt 269.0 lb

## 2021-08-23 DIAGNOSIS — Z124 Encounter for screening for malignant neoplasm of cervix: Secondary | ICD-10-CM | POA: Diagnosis not present

## 2021-08-23 DIAGNOSIS — Z113 Encounter for screening for infections with a predominantly sexual mode of transmission: Secondary | ICD-10-CM | POA: Diagnosis not present

## 2021-08-23 DIAGNOSIS — R000 Tachycardia, unspecified: Secondary | ICD-10-CM

## 2021-08-23 DIAGNOSIS — R1030 Lower abdominal pain, unspecified: Secondary | ICD-10-CM

## 2021-08-23 LAB — POCT URINALYSIS DIP (MANUAL ENTRY)
Blood, UA: NEGATIVE
Glucose, UA: NEGATIVE mg/dL
Ketones, POC UA: NEGATIVE mg/dL
Leukocytes, UA: NEGATIVE
Nitrite, UA: NEGATIVE
Protein Ur, POC: NEGATIVE mg/dL
Spec Grav, UA: >=1.03 — AB (ref 1.010–1.025)
Urobilinogen, UA: 0.2 E.U./dL
pH, UA: 6 (ref 5.0–8.0)

## 2021-08-23 NOTE — Progress Notes (Signed)
    SUBJECTIVE:   CHIEF COMPLAINT / HPI: Lower abdominal pain  PERTINENT  PMH / PSH: obesity, chronic anemia  Abdominal pain Pt has had lower abdominal pain for the past couple weeks. She started her LMP on 08/13/21 a few days after the pain began.  The pain stopped once her period started but came back on 8/26, after he period had finished. She describes the pain as feeling like muscle spasms.  It hurts worse when her bladder is full, when she urinates, and when she has a BM.  Se states she also feels this spasm when she eats.  She rates the pain at a 10/10. She has not taken any medicine for the pain or used a heating pad. Taking a hot shower relieves the pain a little. She has never had pain like this before. No history of urinary tract infections. Is sexually active with one new partner, no condoms. She would like to be checked for STIs today.  No change in vaginal discharge, states it is normal and clear. No odor.   Patient due for routine Pap smear  OBJECTIVE:   Today's Vitals   08/23/21 1009 08/23/21 1146  BP: (!) 143/81 130/70  Pulse: (!) 114 96  SpO2: 99% 99%  Weight: 269 lb (122 kg)   PainSc: 10-Worst pain ever   PainLoc: Abdomen    Body mass index is 44.76 kg/m.   General: NAD, pleasant, able to participate in exam Cardiac: RRR, no murmurs. Respiratory: CTAB, normal effort, No wheezes, rales or rhonchi Abdomen: Bowel sounds present, nondistended, soft, tender to palpation in right upper and lower quadrants, no suprapubic tenderness Genitourinary: Normal genitalia, no ulcers, lesions, warts on external skin, or in vaginal canal. Nabothian cyst present at 2:00 on the cervix.  White discharge present in vaginal canal.  Neuro: alert, no obvious focal deficits Psych: Normal affect and mood  ASSESSMENT/PLAN:   Abdominal pain As patient recently has a new sexual partner, she agreed to have STI testing as well as a urinalysis.  The urinalysis was negative for a urinary tract  infection.  Because there was a small amount of bilirubin in her urine and she had right upper and lower quadrant tenderness to palpation on exam, CMP was ordered.  -RPR -HIV -Cervicovaginal ancillary -Heating pad, and alternate Tylenol and ibuprofen for pain -Patient given strict return/ED precautions -Return in 1 week for a repeat urinalysis  Tachycardia Pulse was 114 at the start of the visit with blood pressure elevated at 143/80.  Both pulse and blood pressure were rechecked at the end of the visit and were within normal limits.  Patient has a history of chronic tachycardia with EKGs from 2019 in 2020 showing sinus tachycardia but otherwise normal.  Last TSH was in 2016 -TSH  Health maintenance -Pap smear with HPV testing  Dr. Precious Gilding, [place]

## 2021-08-23 NOTE — Patient Instructions (Addendum)
It was great to see you! Thank you for allowing me to participate in your care!  I recommend that you always bring your medications to each appointment as this makes it easy to ensure you are on the correct medications and helps [place] not miss when refills are needed.  Our plans for today:  -Today you had a Pap smear to test for abnormal cervical cells and screen for cervical cancer -You were tested for sexually transmitted infections -You had a urinalysis to check for a urinary tract infection which was negative but showed a small amount of bilirubin in your urine.  Please return to have your urine checked in 1 week -We are checking your thyroid function today because your heart was beating a little bit fast -If your abdominal pain continues or get worse please let us know. -If you experience any shortness of breath, chest pain, dizziness, loss of consciousness please seek help at the emergency department  We are checking some labs today, I will call you if they are abnormal will send you a MyChart message or a letter if they are normal.  If you do not hear about your labs in the next 2 weeks please let us know.  Take care and seek immediate care sooner if you develop any concerns.   Dr. Precious Gilding, DO [facility]

## 2021-08-24 ENCOUNTER — Encounter: Payer: Self-pay | Admitting: Student

## 2021-08-24 DIAGNOSIS — R8290 Unspecified abnormal findings in urine: Secondary | ICD-10-CM

## 2021-08-24 LAB — CERVICOVAGINAL ANCILLARY ONLY
Bacterial Vaginitis (gardnerella): POSITIVE — AB
Candida Glabrata: NEGATIVE
Candida Vaginitis: NEGATIVE
Chlamydia: NEGATIVE
Comment: NEGATIVE
Comment: NEGATIVE
Comment: NEGATIVE
Comment: NEGATIVE
Comment: NEGATIVE
Comment: NORMAL
Neisseria Gonorrhea: NEGATIVE
Trichomonas: NEGATIVE

## 2021-08-24 LAB — CYTOLOGY - PAP
Adequacy: ABSENT
Comment: NEGATIVE
Diagnosis: NEGATIVE
High risk HPV: NEGATIVE

## 2021-08-24 LAB — COMPREHENSIVE METABOLIC PANEL
ALT: 9 IU/L (ref 0–32)
AST: 11 IU/L (ref 0–40)
Albumin/Globulin Ratio: 1 — ABNORMAL LOW (ref 1.2–2.2)
Albumin: 3.4 g/dL — ABNORMAL LOW (ref 3.8–4.8)
Alkaline Phosphatase: 92 IU/L (ref 44–121)
BUN/Creatinine Ratio: 5 — ABNORMAL LOW (ref 9–23)
BUN: 4 mg/dL — ABNORMAL LOW (ref 6–24)
Bilirubin Total: <0.2 mg/dL (ref 0.0–1.2)
CO2: 24 mmol/L (ref 20–29)
Calcium: 9 mg/dL (ref 8.7–10.2)
Chloride: 103 mmol/L (ref 96–106)
Creatinine, Ser: 0.88 mg/dL (ref 0.57–1.00)
Globulin, Total: 3.4 g/dL (ref 1.5–4.5)
Glucose: 99 mg/dL (ref 65–99)
Potassium: 3.6 mmol/L (ref 3.5–5.2)
Sodium: 139 mmol/L (ref 134–144)
Total Protein: 6.8 g/dL (ref 6.0–8.5)
eGFR: 83 mL/min/{1.73_m2} (ref >59)

## 2021-08-24 LAB — HIV ANTIBODY (ROUTINE TESTING W REFLEX): HIV Screen 4th Generation wRfx: NONREACTIVE

## 2021-08-24 LAB — TSH: TSH: 2.51 u[IU]/mL (ref 0.450–4.500)

## 2021-08-24 LAB — RPR: RPR Ser Ql: NONREACTIVE

## 2021-08-24 NOTE — Progress Notes (Signed)
Sent letter to patient with normal test results

## 2021-08-25 ENCOUNTER — Telehealth: Payer: Self-pay | Admitting: Student

## 2021-08-25 ENCOUNTER — Emergency Department (HOSPITAL_COMMUNITY)
Admission: EM | Admit: 2021-08-25 | Discharge: 2021-08-25 | Disposition: A | Discharge status: Home / Self Care | Payer: Medicaid Other | Attending: Emergency Medicine | Admitting: Emergency Medicine

## 2021-08-25 DIAGNOSIS — R1084 Generalized abdominal pain: Secondary | ICD-10-CM | POA: Insufficient documentation

## 2021-08-25 DIAGNOSIS — F17210 Nicotine dependence, cigarettes, uncomplicated: Secondary | ICD-10-CM | POA: Insufficient documentation

## 2021-08-25 DIAGNOSIS — N9489 Other specified conditions associated with female genital organs and menstrual cycle: Secondary | ICD-10-CM | POA: Insufficient documentation

## 2021-08-25 LAB — URINALYSIS, ROUTINE W REFLEX MICROSCOPIC
Bacteria, UA: NONE SEEN
Bilirubin Urine: NEGATIVE
Glucose, UA: NEGATIVE mg/dL
Ketones, ur: NEGATIVE mg/dL
Leukocytes,Ua: NEGATIVE
Nitrite: NEGATIVE
Protein, ur: NEGATIVE mg/dL
Specific Gravity, Urine: 1.024 (ref 1.005–1.030)
pH: 6 (ref 5.0–8.0)

## 2021-08-25 LAB — CBC WITH DIFFERENTIAL/PLATELET
Abs Immature Granulocytes: 0.02 10*3/uL (ref 0.00–0.07)
Basophils Absolute: 0 10*3/uL (ref 0.0–0.1)
Basophils Relative: 0 %
Eosinophils Absolute: 0.1 10*3/uL (ref 0.0–0.5)
Eosinophils Relative: 1 %
HCT: 33.4 % — ABNORMAL LOW (ref 36.0–46.0)
Hemoglobin: 9.4 g/dL — ABNORMAL LOW (ref 12.0–15.0)
Immature Granulocytes: 0 %
Lymphocytes Relative: 23 %
Lymphs Abs: 2.1 10*3/uL (ref 0.7–4.0)
MCH: 22.2 pg — ABNORMAL LOW (ref 26.0–34.0)
MCHC: 28.1 g/dL — ABNORMAL LOW (ref 30.0–36.0)
MCV: 79 fL — ABNORMAL LOW (ref 80.0–100.0)
Monocytes Absolute: 0.7 10*3/uL (ref 0.1–1.0)
Monocytes Relative: 8 %
Neutro Abs: 6.2 10*3/uL (ref 1.7–7.7)
Neutrophils Relative %: 68 %
Platelets: 427 10*3/uL — ABNORMAL HIGH (ref 150–400)
RBC: 4.23 MIL/uL (ref 3.87–5.11)
RDW: 18.5 % — ABNORMAL HIGH (ref 11.5–15.5)
WBC: 9.1 10*3/uL (ref 4.0–10.5)
nRBC: 0 % (ref 0.0–0.2)

## 2021-08-25 LAB — COMPREHENSIVE METABOLIC PANEL
ALT: 14 U/L (ref 0–44)
AST: 22 U/L (ref 15–41)
Albumin: 2.8 g/dL — ABNORMAL LOW (ref 3.5–5.0)
Alkaline Phosphatase: 83 U/L (ref 38–126)
Anion gap: 9 (ref 5–15)
BUN: 5 mg/dL — ABNORMAL LOW (ref 6–20)
CO2: 22 mmol/L (ref 22–32)
Calcium: 8.7 mg/dL — ABNORMAL LOW (ref 8.9–10.3)
Chloride: 107 mmol/L (ref 98–111)
Creatinine, Ser: 0.78 mg/dL (ref 0.44–1.00)
GFR, Estimated: >60 mL/min (ref >60)
Glucose, Bld: 99 mg/dL (ref 70–99)
Potassium: 3.9 mmol/L (ref 3.5–5.1)
Sodium: 138 mmol/L (ref 135–145)
Total Bilirubin: 0.3 mg/dL (ref 0.3–1.2)
Total Protein: 6.9 g/dL (ref 6.5–8.1)

## 2021-08-25 LAB — I-STAT BETA HCG BLOOD, ED (MC, WL, AP ONLY): I-stat hCG, quantitative: <5 m[IU]/mL (ref <5)

## 2021-08-25 LAB — LIPASE, BLOOD: Lipase: 27 U/L (ref 11–51)

## 2021-08-25 NOTE — Discharge Instructions (Addendum)
The testing today does not show any serious problems with your abdomen.  It is likely that you are having some intestinal discomfort, contributing to your pain.  To improve intestinal function, we recommend using a high-fiber diet and if needed, a fiber supplement.  Attached is information on a high-fiber diet.  There are various fiber supplements available, at any pharmacy.  Some, names include Metamucil, and Benefiber.  Call the gastroenterologist, if you feel that the dietary and fiber additions, do not help your discomfort.  Use Tylenol every 4 hours for pain.  Follow-up with your primary care doctor if needed.

## 2021-08-25 NOTE — Telephone Encounter (Signed)
Attempted to call pt to discuss lab results and next steps. Voice mail box was full. Will attempt again later today.

## 2021-08-25 NOTE — ED Provider Notes (Signed)
Emergency Medicine Provider Triage Evaluation Note  Candace Ramirez , a 45 y.o. female  was evaluated in triage.  Pt complains of lower abd/pelvic pain onset Aug 19th.  Pt reports she had 3 days of the pain but it resolved when she started her menstrual cycle.  Pt reports it returned 3 days ago and has worsened.  Reports she had some spotting last night.  She is sexually active, but has not attempted a home pregnancy test.  She does have a hx of ectopic pregnancy and reports this feels similar.  No fever, chills, syncope or urinary symptoms.  Review of Systems  Positive: Lower abd pain Negative: Nausea, vomiting  Physical Exam  BP (!) 141/100 (BP Location: Right Arm)   Pulse (!) 108   Temp 98.7 F (37.1 C) (Oral)   Resp 17   SpO2 98%  Gen:   Awake, no distress   Resp:  Normal effort  MSK:   Moves extremities without difficulty  Other:  TTP in the pelvic region and general discomfort in the rest of the abd.  Medical Decision Making  Medically screening exam initiated at 6:02 AM.  Appropriate orders placed.  Candace Ramirez was informed that the remainder of the evaluation will be completed by another provider, this initial triage assessment does not replace that evaluation, and the importance of remaining in the ED until their evaluation is complete.  Abd pain.  Primary concern for ectopic pregnancy, ovarian cyst, or ovarian torsion. Labs pending. Will order imaging when preg test results.   Candace Ramirez, Candace Ramirez [id number]    Candace Fuel, MD [phone]

## 2021-08-25 NOTE — ED Provider Notes (Signed)
[facility] EMERGENCY DEPARTMENT Provider Note   CSN: [id number] Arrival date & time: 08/25/21  0548     History Chief Complaint  Patient presents with   Candace Ramirez    KARLEN UMBARGER is a 45 y.o. female.  HPI She presents for evaluation of Candace discomfort which comes and goes, and tends to start in the lower abdomen and moved to the upper abdomen.  It comes on in waves, without any particular known cause.  She reports frequent small stools, but no excessive belching or flatus.  She does not feel like she is having diarrhea or constipation.  She has not had this previously.  She saw her PCP, 2 days ago with concern for STD, so was screened.  Screening tests have returned normal, without signs of STD.  The Gardnerella test was positive.  Patient does not have vaginal discharge at this time.  She also does not have vaginal odor, which she is concerned about.  She is not having any fever, anorexia, cough, shortness of breath or chest Ramirez.  She had a brief episode of vaginal bleeding, 2 days ago but otherwise normal monthly periods with last menstrual period on August 13, 2021 there are no other known active modifying factors.    Past Medical History:  Diagnosis Date   Anemia    Anxiety state, unspecified 01/01/2008   Did not have time to discuss at today's appoint.  Pt to make an appointment in 2-4 weeks to discuss in more detail.        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   Qualifier: Diagnosis of  By: Barbaraann Barthel MD, Shane     DVT (deep venous thrombosis) ([place]) 2003   also had an infection in left ankle at same time; not pregnant (lovenox/coumadin)   Fibroid    Hiatal hernia    Laceration of right knee with tendon involvement 06/09/2018   Obesity    Recurrent ventral hernia 10/12/2016   Traumatic tear of lateral meniscus of right knee [id number]   Umbilical hernia 0000000    With repair October 2014     Patient Active Problem List   Diagnosis Date Noted   Open wound of left lower leg 12/27/2019   Chronic anemia 01/01/2008   Obesity 02/22/2007   Tobacco use 02/22/2007    Past Surgical History:  Procedure Laterality Date   DIAGNOSTIC LAPAROSCOPY WITH REMOVAL OF ECTOPIC PREGNANCY N/A 07/24/2015   Procedure: DIAGNOSTIC LAPAROSCOPY WITH REMOVAL OF ECTOPIC PREGNANCY;  Surgeon: Donnamae Jude, MD;  Location: [facility];  Service: Gynecology;  Laterality: N/A;   DILATION AND CURETTAGE OF UTERUS     HERNIA REPAIR     HIATAL HERNIA REPAIR     I & D EXTREMITY Right 06/09/2018   Procedure: IRRIGATION AND DEBRIDEMENT RIGHT KNEE WITH LATERAL MENISCUS REPAIR AND COMPLEX 12CM. LACERATION OF I.Q. BAND REPAIR.;  Surgeon: Marchia Bond, MD;  Location: [facility];  Service: Orthopedics;  Laterality: Right;   INSERTION OF MESH N/A 10/12/2016   Procedure: INSERTION OF MESH;  Surgeon: Autumn Messing III, MD;  Location: [place];  Service: General;  Laterality: N/A;   LAPAROSCOPIC INCISIONAL / UMBILICAL / [place]  10/12/2016   [place] w/mesh/notes 10/12/2016   NO PAST SURGERIES     VENTRAL HERNIA REPAIR N/A 10/12/2016   Procedure: LAPAROSCOPIC VENTRAL HERNIA;  Surgeon: Autumn Messing III, MD;  Location: [facility];  Service: General;  Laterality: N/A;     OB History     Gravida  38   Para  5   Term  4   Preterm  1   AB  6   Living  5      SAB      IAB      Ectopic      Multiple      Live Births              Family History  Problem Relation Age of Onset   COPD Mother    Hypertension Mother    Drug abuse Father        died of drug overdose    Social History   Tobacco Use   Smoking status: Every Day    Packs/day: 1.00    Types: Cigarettes   Smokeless tobacco: Never  Substance Use Topics   Alcohol use: Not Currently    Alcohol/week: 0.0 standard drinks    Comment: socially   Drug use: Yes    Types: Marijuana    Comment: smokes once a week    Home  Medications Prior to Admission medications   Medication Sig Start Date End Date Taking? Authorizing Provider  albuterol (VENTOLIN HFA) 108 (90 Base) MCG/ACT inhaler INHALE 2 PUFFS BY MOUTH EVERY 6 HOURS AS NEEDED FOR WHEEZING OR SHORTNESS OF BREATH 11/04/19   Mullis, Kiersten P, DO  erythromycin ophthalmic ointment Place a 1/2 inch ribbon of ointment into the lower eyelid 5 times daily for 7 days 10/28/19   Hedges, Dellis Filbert, PA-C  naproxen (NAPROSYN) 500 MG tablet Take 1 tablet (500 mg total) by mouth 2 (two) times daily with a meal. 01/15/20   Rumball, Jake Church, DO  silver sulfADIAZINE (SILVADENE) 1 % cream Apply 1 application topically daily. 12/31/19   Mullis, Kiersten P, DO  valACYclovir (VALTREX) 1000 MG tablet Take 1 tablet (1,000 mg total) by mouth 2 (two) times daily. 10/28/19   Bonnita Hollow, MD    Allergies    Patient has no known allergies.  Review of Systems   Review of Systems  All other systems reviewed and are negative.  Physical Exam Updated Vital Signs BP 113/74 (BP Location: Right Arm)   Pulse 99   Temp 98 F (36.7 C) (Oral)   Resp 15   SpO2 100%   Physical Exam Vitals and nursing note reviewed.  Constitutional:      General: She is not in acute distress.    Appearance: She is well-developed. She is not ill-appearing, toxic-appearing or diaphoretic.  HENT:     Head: Normocephalic and atraumatic.  Eyes:     Conjunctiva/sclera: Conjunctivae normal.     Pupils: Pupils are equal, round, and reactive to light.  Neck:     Trachea: Phonation normal.  Cardiovascular:     Rate and Rhythm: Normal rate.  Pulmonary:     Effort: Pulmonary effort is normal.  Candace:     General: There is no distension.     Palpations: Abdomen is soft.     Tenderness: There is no Candace tenderness. There is no guarding.  Musculoskeletal:        General: Normal range of motion.     Cervical back: Normal range of motion and neck supple.  Skin:    General: Skin is warm and dry.   Neurological:     Mental Status: She is alert and oriented to person, place, and time.     Motor: No  abnormal muscle tone.  Psychiatric:        Mood and Affect: Mood normal.        Behavior: Behavior normal.        Thought Content: Thought content normal.        Judgment: Judgment normal.    ED Results / Procedures / Treatments   Labs (all labs ordered are listed, but only abnormal results are displayed) Labs Reviewed  CBC WITH DIFFERENTIAL/PLATELET - Abnormal; Notable for the following components:      Result Value   Hemoglobin 9.4 (*)    HCT 33.4 (*)    MCV 79.0 (*)    MCH 22.2 (*)    MCHC 28.1 (*)    RDW 18.5 (*)    Platelets 427 (*)    All other components within normal limits  COMPREHENSIVE METABOLIC PANEL - Abnormal; Notable for the following components:   BUN 5 (*)    Calcium 8.7 (*)    Albumin 2.8 (*)    All other components within normal limits  URINALYSIS, ROUTINE W REFLEX MICROSCOPIC - Abnormal; Notable for the following components:   APPearance HAZY (*)    Hgb urine dipstick MODERATE (*)    All other components within normal limits  LIPASE, BLOOD  I-STAT BETA HCG BLOOD, ED ([facility], [facility], [facility] ONLY)    EKG None  Radiology No results found.  Procedures Procedures   Medications Ordered in ED Medications - No data to display  ED Course  I have reviewed the triage vital signs and the nursing notes.  Pertinent labs & imaging results that were available during my care of the patient were reviewed by me and considered in my medical decision making (see chart for details).    MDM Rules/Calculators/A&P                            Patient Vitals for the past 24 hrs:  BP Temp Temp src Pulse Resp SpO2  08/25/21 0845 113/74 98 F (36.7 C) Oral -- 15 100 %  08/25/21 0815 (!) 158/98 -- -- 99 16 100 %  08/25/21 0557 (!) 141/100 98.7 F (37.1 C) Oral (!) 108 17 98 %    9:10 AM Reevaluation with update and discussion. After initial assessment and treatment, an  updated evaluation reveals no change in clinical status.  As I began discussing the findings and recommended treatment for her Candace discomfort she began to cry.  She indicated that she has ongoing Ramirez, and that she was wondering why she was not getting an ultrasound or CAT scan.  I discussed the pluses and minuses of these test.  The patient does not have indication currently for requirement of any imaging.  I offered to do that if she wanted to, but then she agreed to a trial of fiber to improve her stooling, to see if that helps her discomfort.  She also agrees to referral to GI for further testing and evaluation as needed.  All findings discussed and questions were answered. Daleen Bo   Medical Decision Making:  This patient is presenting for evaluation of Candace Ramirez with stooling disorder, which does require a range of treatment options, and is a complaint that involves a moderate risk of morbidity and mortality. The differential diagnoses include colitis, liver/gallbladder problems, stooling disorders. I decided to review old records, and in summary obese middle-aged female presenting for ongoing symptoms associated with abnormal stooling pattern.  Recent STD screening  was negative, 2 days ago.  She does not have chronic medical problems.  I did not require additional historical information from any.  Clinical Laboratory Tests Ordered, included CBC, Metabolic panel, Urinalysis, and Pregnancy test. Review indicates normal except chronic anemia with decreased MCV, low calcium, low albumin.   Critical Interventions-clinical evaluation, laboratory testing, discussion with patient, arrangements for discharge treatment and follow-up  After These Interventions, the Patient was reevaluated and was found stable for discharge.  Patient with nonspecific symptoms, second evaluation for this problem during its duration.  Her evaluation today is reassuring.  Doubt serious intra Candace  abnormalities.  Patient is stable for outpatient management.  CRITICAL CARE-no Performed by: Daleen Bo  Nursing Notes Reviewed/ Care Coordinated Applicable Imaging Reviewed Interpretation of Laboratory Data incorporated into ED treatment  The patient appears reasonably screened and/or stabilized for discharge and I doubt any other medical condition or other [facility] requiring further screening, evaluation, or treatment in the ED at this time prior to discharge.  Plan: Home Medications-Tylenol as needed for Ramirez.; Home Treatments-high-fiber diet, dietary fiber supplement as needed; return here if the recommended treatment, does not improve the symptoms; Recommended follow up-PCP as needed.  GI, if needed for no improvement with fiber treatment     Final Clinical Impression(s) / ED Diagnoses Final diagnoses:  Generalized Candace Ramirez    Rx / DC Orders ED Discharge Orders     None        Daleen Bo, MD 08/25/21 [phone]

## 2021-08-25 NOTE — ED Triage Notes (Signed)
Pt c/o abd pain that began approx 2wks ago, stopped during menstrual cycle & began again Saturday. Sharp, stabbing/aching pain lower abd, denies N/V. Pt endorses "relatively normal" bowel pattern, unsure of ability to pass flatus. No urinary/vaginal symptoms 7/10 achy abd

## 2021-08-26 NOTE — Telephone Encounter (Signed)
Patient returns call to nurse line requesting to speak with provider regarding test results.   Talbot Grumbling, RN

## 2021-08-27 ENCOUNTER — Encounter: Payer: Self-pay | Admitting: Student

## 2021-08-31 ENCOUNTER — Telehealth: Payer: Self-pay | Admitting: Student

## 2021-08-31 NOTE — Telephone Encounter (Signed)
Spoke with patient on the phone regarding results from recent visit. Advised pt that she does not need to take antibiotics for bacterial vaginosis as she does not have any symptoms. Pt stated her abdominal pain she had been seen for at her last visit has gotten much better and needs no further work up at this time.

## 2022-01-04 ENCOUNTER — Other Ambulatory Visit: Payer: Self-pay

## 2022-01-04 ENCOUNTER — Ambulatory Visit (INDEPENDENT_AMBULATORY_CARE_PROVIDER_SITE_OTHER): Payer: Self-pay

## 2022-01-04 DIAGNOSIS — Z111 Encounter for screening for respiratory tuberculosis: Secondary | ICD-10-CM

## 2022-01-04 NOTE — Progress Notes (Signed)
Patient is here for a PPD placement.  PPD placed in right forearm @ 1045 am.  Patient will return 01/06/2022 to have PPD read. Talbot Grumbling, RN

## 2022-01-06 ENCOUNTER — Ambulatory Visit (INDEPENDENT_AMBULATORY_CARE_PROVIDER_SITE_OTHER): Payer: Self-pay

## 2022-01-06 ENCOUNTER — Other Ambulatory Visit: Payer: Self-pay

## 2022-01-06 DIAGNOSIS — Z111 Encounter for screening for respiratory tuberculosis: Secondary | ICD-10-CM

## 2022-01-06 LAB — TB SKIN TEST
Induration: 0 mm
TB Skin Test: NEGATIVE

## 2022-01-07 NOTE — Progress Notes (Signed)
Patient is here for a PPD read.  It was placed on 01/04/2022 in the right forearm @ 1045 am.    PPD RESULTS:  Result: negative Induration: 0 mm  Letter created and given to patient for documentation purposes. Talbot Grumbling, RN

## 2022-05-31 ENCOUNTER — Encounter: Payer: Self-pay | Admitting: *Deleted

## 2023-12-14 ENCOUNTER — Ambulatory Visit: Payer: 59 | Admitting: Family Medicine

## 2023-12-14 NOTE — Progress Notes (Deleted)
    SUBJECTIVE:   CHIEF COMPLAINT / HPI: headache  ***  PERTINENT  PMH / PSH: Chronic Anemia, Tobacco Use  OBJECTIVE:   There were no vitals taken for this visit.  ***  ASSESSMENT/PLAN:   Assessment & Plan  No follow-ups on file.  Celine Mans, MD [facility]

## 2023-12-18 ENCOUNTER — Ambulatory Visit: Payer: 59 | Admitting: Student
